# Patient Record
Sex: Male | Born: 1985
Health system: Southern US, Community
[De-identification: ages and names within clinical notes are randomized; demographics above are authoritative.]

---

## 2007-04-15 ENCOUNTER — Emergency Department (HOSPITAL_COMMUNITY): Admission: EM | Admit: 2007-04-15 | Discharge: 2007-04-15 | Payer: Self-pay | Admitting: Emergency Medicine

## 2008-10-14 ENCOUNTER — Emergency Department (HOSPITAL_COMMUNITY): Admission: EM | Admit: 2008-10-14 | Discharge: 2008-10-14 | Payer: Self-pay | Admitting: Emergency Medicine

## 2011-07-26 ENCOUNTER — Emergency Department (HOSPITAL_COMMUNITY)
Admission: EM | Admit: 2011-07-26 | Discharge: 2011-07-26 | Disposition: A | Discharge status: Home / Self Care | Payer: No Typology Code available for payment source | Attending: Emergency Medicine | Admitting: Emergency Medicine

## 2011-07-26 ENCOUNTER — Emergency Department (HOSPITAL_COMMUNITY): Payer: No Typology Code available for payment source

## 2011-07-26 ENCOUNTER — Encounter (HOSPITAL_COMMUNITY): Payer: Self-pay | Admitting: *Deleted

## 2011-07-26 DIAGNOSIS — R51 Headache: Secondary | ICD-10-CM | POA: Insufficient documentation

## 2011-07-26 DIAGNOSIS — S01409A Unspecified open wound of unspecified cheek and temporomandibular area, initial encounter: Secondary | ICD-10-CM | POA: Insufficient documentation

## 2011-07-26 DIAGNOSIS — S5012XA Contusion of left forearm, initial encounter: Secondary | ICD-10-CM

## 2011-07-26 DIAGNOSIS — S5010XA Contusion of unspecified forearm, initial encounter: Secondary | ICD-10-CM | POA: Insufficient documentation

## 2011-07-26 DIAGNOSIS — S51809A Unspecified open wound of unspecified forearm, initial encounter: Secondary | ICD-10-CM | POA: Insufficient documentation

## 2011-07-26 DIAGNOSIS — M79609 Pain in unspecified limb: Secondary | ICD-10-CM | POA: Insufficient documentation

## 2011-07-26 MED ORDER — OXYCODONE-ACETAMINOPHEN 5-325 MG PO TABS
1.0000 | ORAL_TABLET | Freq: Four times a day (QID) | ORAL | Status: AC | PRN
Start: 2011-07-26 — End: 2011-08-05

## 2011-07-26 MED ORDER — TETANUS-DIPHTH-ACELL PERTUSSIS 5-2.5-18.5 LF-MCG/0.5 IM SUSP
0.5000 mL | Freq: Once | INTRAMUSCULAR | Status: AC
  Administered 2011-07-26: 0.5 mL via INTRAMUSCULAR
  Filled 2011-07-26: qty 0.5

## 2011-07-26 MED ORDER — ACETAMINOPHEN 325 MG PO TABS
650.0000 mg | ORAL_TABLET | Freq: Once | ORAL | Status: AC
  Administered 2011-07-26: 650 mg via ORAL
  Filled 2011-07-26: qty 2

## 2011-07-26 MED ORDER — IBUPROFEN 800 MG PO TABS
800.0000 mg | ORAL_TABLET | Freq: Once | ORAL | Status: AC
  Administered 2011-07-26: 800 mg via ORAL
  Filled 2011-07-26: qty 1

## 2011-07-26 NOTE — Discharge Instructions (Signed)
X-ray was normal. Be sore for several days. Daily shower to forearm and simple dressing. Wound will scab over  the next couple weeks.  Pain meds

## 2011-07-26 NOTE — ED Notes (Signed)
MD at bedside. 
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
## 2011-07-26 NOTE — ED Notes (Signed)
Pt stable at discharge; pt states that he is going to walk home that he does not need a ride; Encouraged pt to obtain a ride; pt still refuses
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
## 2016-01-21 ENCOUNTER — Encounter: Payer: Self-pay | Admitting: Physician Assistant

## 2016-01-21 ENCOUNTER — Ambulatory Visit (INDEPENDENT_AMBULATORY_CARE_PROVIDER_SITE_OTHER): Payer: BLUE CROSS/BLUE SHIELD | Admitting: Physician Assistant

## 2016-01-21 VITALS — BP 130/80 | HR 72 | Temp 98.4°F | Resp 16 | Ht 63.5 in | Wt 169.0 lb

## 2016-01-21 DIAGNOSIS — Z0000 Encounter for general adult medical examination without abnormal findings: Secondary | ICD-10-CM

## 2016-01-21 DIAGNOSIS — Z7689 Persons encountering health services in other specified circumstances: Secondary | ICD-10-CM

## 2016-01-21 NOTE — Progress Notes (Signed)
Patient ID: Ronald Combs MRN: [id number], DOB: 1985/11/21 30 y.o. Date of Encounter: 01/21/2016, 10:49 AM    Chief Complaint: Physical (CPE)  HPI: 30 y.o. y/o AA male here for CPE.   Patient presents as a new patient to establish care. He states that he needs this form completed which states that he has had annual physical and screening including blood pressure cholesterol and blood sugar. He states that he has no other complaints or concerns that he needs to address today.  He just ate a sandwich about 20 minutes ago. He is not fasting. He states that he works 7 AM to 5 PM Monday through Thursday but is off on Fridays. Can return this Friday fasting for labs. He says that he did have labs drawn at his job several months ago and will see if he get a copy of his lab results to me. If not he is going to return Friday morning for fasting labs.   Review of Systems: Consitutional: No fever, chills, fatigue, night sweats, lymphadenopathy, or weight changes. Eyes: No visual changes, eye redness, or discharge. ENT/Mouth: Ears: No otalgia, tinnitus, hearing loss, discharge. Nose: No congestion, rhinorrhea, sinus pain, or epistaxis. Throat: No sore throat, post nasal drip, or teeth pain. Cardiovascular: No CP, palpitations, diaphoresis, DOE, edema, orthopnea, PND. Respiratory: No cough, hemoptysis, SOB, or wheezing. Gastrointestinal: No anorexia, dysphagia, reflux, pain, nausea, vomiting, hematemesis, diarrhea, constipation, BRBPR, or melena. Genitourinary: No dysuria, frequency, urgency, hematuria, incontinence, nocturia, decreased urinary stream, discharge, impotence, or testicular pain/masses. Musculoskeletal: No decreased ROM, myalgias, stiffness, joint swelling, or weakness. Skin: No rash, erythema, lesion changes, pain, warmth, jaundice, or pruritis. Neurological: No headache, dizziness, syncope, seizures, tremors, memory loss, coordination problems, or paresthesias. Psychological: No  anxiety, depression, hallucinations, SI/HI. Endocrine: No fatigue, polydipsia, polyphagia, polyuria, or known diabetes. All other systems were reviewed and are otherwise negative.  History reviewed. No pertinent past medical history.   History reviewed. No pertinent surgical history.  Home Meds:  Outpatient Medications Prior to Visit  Medication Sig Dispense Refill  . ibuprofen (ADVIL,MOTRIN) 200 MG tablet Take 400 mg by mouth every 6 (six) hours as needed. For pain     No facility-administered medications prior to visit.     Allergies: No Known Allergies  Social History   Social History  . Marital status: Single    Spouse name: N/A  . Number of children: N/A  . Years of education: N/A   Occupational History  . Not on file.   Social History Main Topics  . Smoking status: Current Some Day Smoker  . Smokeless tobacco: Former NeurosurgeonUser  . Alcohol use Yes  . Drug use: No  . Sexual activity: No   Other Topics Concern  . Not on file   Social History Narrative  . No narrative on file  He states that he very rarely smokes.  History reviewed. No pertinent family history.  Both of his parents are living and have no known medical problems. He has one sister and 2 brothers and they are all living and no known medical problems.  Physical Exam: Blood pressure 130/80, pulse 72, temperature 98.4 F (36.9 C), temperature source Oral, resp. rate 16, height 5' 3.5" (1.613 m), weight 169 lb (76.7 kg), SpO2 99 %.  General: Well developed, well nourished AAM. Appears in no acute distress. HEENT: Normocephalic, atraumatic. Conjunctiva pink, sclera non-icteric. Pupils 2 mm constricting to 1 mm, round, regular, and equally reactive to light and accomodation. EOMI. Internal auditory  canal clear. TMs with good cone of light and without pathology. Nasal mucosa pink. Nares are without discharge. No sinus tenderness. Oral mucosa pink. Pharynx without exudate.   Neck: Supple. Trachea midline. No  thyromegaly. Full ROM. No lymphadenopathy. Lungs: Clear to auscultation bilaterally without wheezes, rales, or rhonchi. Breathing is of normal effort and unlabored. Cardiovascular: RRR with S1 S2. No murmurs, rubs, or gallops. Distal pulses 2+ symmetrically. No carotid or abdominal bruits. Abdomen: Soft, non-tender, non-distended with normoactive bowel sounds. No hepatosplenomegaly or masses. No rebound/guarding. No CVA tenderness. No hernias. Musculoskeletal: Full range of motion and 5/5 strength throughout. Skin: Warm and moist without erythema, ecchymosis, wounds, or rash. Neuro: A+Ox3. CN II-XII grossly intact. Moves all extremities spontaneously. Full sensation throughout. Normal gait. DTR 2+ throughout upper and lower extremities.  Psych:  Responds to questions appropriately with a normal affect.   Assessment/Plan:  30 y.o. y/o AA male here for CPE  -1. Encounter to establish care  2. Encounter for preventive health examination  A. Screening Labs: - CBC with Differential/Platelet; Future - COMPLETE METABOLIC PANEL WITH GFR; Future - Lipid panel; Future - TSH; Future  B. Immunizations: Flu----------------- recommended flu vaccine but he defers. Tetanus------- he reports that he had a tetanus vaccine 2-4 years ago. Says that this is up to date. Pneumococcal--- he very rarely smokes. Do not think this warrants a pneumonia vaccine.  He will return for labs on Friday morning. His form for his wellness program will be given back to him when he comes Friday for labs. His form does not require the actual lab results but rather just my signature stating that he did have age appropriate exam and that we did check blood pressure cholesterol and blood sugar.  Signed:   [address]Valoria Tamburri Beth Dale[place], [place]  01/21/2016 10:49 AM

## 2016-01-23 ENCOUNTER — Other Ambulatory Visit: Payer: BLUE CROSS/BLUE SHIELD

## 2016-01-23 DIAGNOSIS — Z0000 Encounter for general adult medical examination without abnormal findings: Secondary | ICD-10-CM | POA: Diagnosis not present

## 2016-01-23 LAB — CBC WITH DIFFERENTIAL/PLATELET
Basophils Absolute: 47 cells/uL (ref 0–200)
Basophils Relative: 1 %
Eosinophils Absolute: 141 cells/uL (ref 15–500)
Eosinophils Relative: 3 %
HEMATOCRIT: 46 % (ref 38.5–50.0)
Hemoglobin: 15.7 g/dL (ref 13.0–17.0)
LYMPHS PCT: 45 %
Lymphs Abs: 2115 cells/uL (ref 850–3900)
MCH: 31.5 pg (ref 27.0–33.0)
MCHC: 34.1 g/dL (ref 32.0–36.0)
MCV: 92.2 fL (ref 80.0–100.0)
MONO ABS: 376 {cells}/uL (ref 200–950)
MPV: 10.7 fL (ref 7.5–12.5)
Monocytes Relative: 8 %
NEUTROS PCT: 43 %
Neutro Abs: 2021 cells/uL (ref 1500–7800)
Platelets: 231 10*3/uL (ref 140–400)
RBC: 4.99 MIL/uL (ref 4.20–5.80)
RDW: 13.9 % (ref 11.0–15.0)
WBC: 4.7 10*3/uL (ref 3.8–10.8)

## 2016-01-23 LAB — COMPLETE METABOLIC PANEL WITH GFR
ALT: 68 U/L — ABNORMAL HIGH (ref 9–46)
AST: 35 U/L (ref 10–40)
Albumin: 4.8 g/dL (ref 3.6–5.1)
Alkaline Phosphatase: 69 U/L (ref 40–115)
BUN: 15 mg/dL (ref 7–25)
CALCIUM: 10.6 mg/dL — AB (ref 8.6–10.3)
CHLORIDE: 102 mmol/L (ref 98–110)
CO2: 28 mmol/L (ref 20–31)
Creat: 0.99 mg/dL (ref 0.60–1.35)
GFR, Est Non African American: >89 mL/min (ref >=60)
Glucose, Bld: 95 mg/dL (ref 70–99)
POTASSIUM: 4.6 mmol/L (ref 3.5–5.3)
Sodium: 138 mmol/L (ref 135–146)
Total Bilirubin: 1.1 mg/dL (ref 0.2–1.2)
Total Protein: 7.6 g/dL (ref 6.1–8.1)

## 2016-01-23 LAB — TSH: TSH: 1.24 mIU/L (ref 0.40–4.50)

## 2016-01-23 LAB — LIPID PANEL
CHOL/HDL RATIO: 2.9 ratio (ref <5.0)
CHOLESTEROL: 151 mg/dL (ref <200)
HDL: 52 mg/dL (ref >40)
LDL Cholesterol: 78 mg/dL (ref <100)
TRIGLYCERIDES: 105 mg/dL (ref <150)
VLDL: 21 mg/dL (ref <30)

## 2017-01-24 ENCOUNTER — Ambulatory Visit (INDEPENDENT_AMBULATORY_CARE_PROVIDER_SITE_OTHER): Payer: BLUE CROSS/BLUE SHIELD | Admitting: Physician Assistant

## 2017-01-24 ENCOUNTER — Other Ambulatory Visit: Payer: Self-pay

## 2017-01-24 ENCOUNTER — Encounter: Payer: Self-pay | Admitting: Physician Assistant

## 2017-01-24 VITALS — BP 118/82 | HR 64 | Temp 98.4°F | Resp 16 | Ht 64.0 in | Wt 162.0 lb

## 2017-01-24 DIAGNOSIS — Z114 Encounter for screening for human immunodeficiency virus [HIV]: Secondary | ICD-10-CM | POA: Diagnosis not present

## 2017-01-24 DIAGNOSIS — Z0000 Encounter for general adult medical examination without abnormal findings: Secondary | ICD-10-CM | POA: Diagnosis not present

## 2017-01-24 DIAGNOSIS — R739 Hyperglycemia, unspecified: Secondary | ICD-10-CM | POA: Diagnosis not present

## 2017-01-24 NOTE — Progress Notes (Signed)
Patient ID: Ronald Combs MRN: [id number], DOB: 03-Feb-1986 31 y.o. Date of Encounter: 01/24/2017, 10:32 AM    Chief Complaint: Physical (CPE)  HPI: 31 y.o. y/o AA male here for CPE.   01/2016: Patient presents as a new patient to establish care. He states that he needs this form completed which states that he has had annual physical and screening including blood pressure cholesterol and blood sugar. He states that he has no other complaints or concerns that he needs to address today.  He just ate a sandwich about 20 minutes ago. He is not fasting. He states that he works 7 AM to 5 PM Monday through Thursday but is off on Fridays. Can return this Friday fasting for labs. He says that he did have labs drawn at his job several months ago and will see if he get a copy of his lab results to me. If not he is going to return Friday morning for fasting labs.   01/24/2017: Reports that he has had no medical issues this past year.  Says that he really has not even been sick at all. Also reports that he has no updates regarding family medical history. He has no specific concerns to address. Has form to document that he is come in for age-appropriate wellness exam. Is fasting and agreeable to do labs.  Does not want flu vaccine.    Review of Systems: Consitutional: No fever, chills, fatigue, night sweats, lymphadenopathy, or weight changes. Eyes: No visual changes, eye redness, or discharge. ENT/Mouth: Ears: No otalgia, tinnitus, hearing loss, discharge. Nose: No congestion, rhinorrhea, sinus pain, or epistaxis. Throat: No sore throat, post nasal drip, or teeth pain. Cardiovascular: No CP, palpitations, diaphoresis, DOE, edema, orthopnea, PND. Respiratory: No cough, hemoptysis, SOB, or wheezing. Gastrointestinal: No anorexia, dysphagia, reflux, pain, nausea, vomiting, hematemesis, diarrhea, constipation, BRBPR, or melena. Genitourinary: No dysuria, frequency, urgency, hematuria,  incontinence, nocturia, decreased urinary stream, discharge, impotence, or testicular pain/masses. Musculoskeletal: No decreased ROM, myalgias, stiffness, joint swelling, or weakness. Skin: No rash, erythema, lesion changes, pain, warmth, jaundice, or pruritis. Neurological: No headache, dizziness, syncope, seizures, tremors, memory loss, coordination problems, or paresthesias. Psychological: No anxiety, depression, hallucinations, SI/HI. Endocrine: No fatigue, polydipsia, polyphagia, polyuria, or known diabetes. All other systems were reviewed and are otherwise negative.  History reviewed. No pertinent past medical history.   History reviewed. No pertinent surgical history.  Home Meds:  Outpatient Medications Prior to Visit  Medication Sig Dispense Refill  . ibuprofen (ADVIL,MOTRIN) 200 MG tablet Take 400 mg by mouth every 6 (six) hours as needed. For pain     No facility-administered medications prior to visit.     Allergies: No Known Allergies  Social History   Socioeconomic History  . Marital status: Single    Spouse name: Not on file  . Number of children: Not on file  . Years of education: Not on file  . Highest education level: Not on file  Social Needs  . Financial resource strain: Not on file  . Food insecurity - worry: Not on file  . Food insecurity - inability: Not on file  . Transportation needs - medical: Not on file  . Transportation needs - non-medical: Not on file  Occupational History  . Not on file  Tobacco Use  . Smoking status: Former Smoker    Last attempt to quit: 03/26/2016    Years since quitting: 0.8  . Smokeless tobacco: Former Engineer, waterUser  Substance and Sexual Activity  . Alcohol use:  Yes  . Drug use: No  . Sexual activity: No  Other Topics Concern  . Not on file  Social History Narrative  . Not on file  He states that he very rarely smokes.  History reviewed. No pertinent family history.  Both of his parents are living and have no known  medical problems. He has one sister and 2 brothers and they are all living and no known medical problems.  Physical Exam: Blood pressure 118/82, pulse 64, temperature 98.4 F (36.9 C), temperature source Oral, resp. rate 16, height 5\' 4"  (1.626 m), weight 73.5 kg (162 lb), SpO2 98 %.  General: Well developed, well nourished AAM. Appears in no acute distress. HEENT: Normocephalic, atraumatic. Conjunctiva pink, sclera non-icteric. Pupils 2 mm constricting to 1 mm, round, regular, and equally reactive to light and accomodation. EOMI. Internal auditory canal clear. TMs with good cone of light and without pathology. Nasal mucosa pink. Nares are without discharge. No sinus tenderness. Oral mucosa pink. Pharynx without exudate.   Neck: Supple. Trachea midline. No thyromegaly. Full ROM. No lymphadenopathy. Lungs: Clear to auscultation bilaterally without wheezes, rales, or rhonchi. Breathing is of normal effort and unlabored. Cardiovascular: RRR with S1 S2. No murmurs, rubs, or gallops. Distal pulses 2+ symmetrically. No carotid or abdominal bruits. Abdomen: Soft, non-tender, non-distended with normoactive bowel sounds. No hepatosplenomegaly or masses. No rebound/guarding. No CVA tenderness. No hernias. Musculoskeletal: Full range of motion and 5/5 strength throughout. Skin: Warm and moist without erythema, ecchymosis, wounds, or rash. Neuro: A+Ox3. CN II-XII grossly intact. Moves all extremities spontaneously. Full sensation throughout. Normal gait.  Psych:  Responds to questions appropriately with a normal affect.   Assessment/Plan:  31 y.o. y/o AA male here for CPE   Encounter for preventive health examination A. Screening Labs: - HIV antibody - CBC with Differential/Platelet - COMPLETE METABOLIC PANEL WITH GFR - Lipid panel B. Immunizations: Flu----------------- recommended flu vaccine but he defers. Tetanus------- he reports that he had a tetanus vaccine 2-4 years ago. Says that this is up  to date. Pneumococcal--- he very rarely smokes. Do not think this warrants a pneumonia vaccine.   Screening for HIV (human immunodeficiency virus) - HIV antibody  Follow-up in 1 year or sooner if needed.   Signed:   [address]Mary Beth Union[place], [place]  01/24/2017 10:32 AM

## 2017-01-29 LAB — COMPLETE METABOLIC PANEL WITH GFR
AG Ratio: 1.6 (calc) (ref 1.0–2.5)
ALKALINE PHOSPHATASE (APISO): 70 U/L (ref 40–115)
ALT: 43 U/L (ref 9–46)
AST: 38 U/L (ref 10–40)
Albumin: 4.7 g/dL (ref 3.6–5.1)
BILIRUBIN TOTAL: 1 mg/dL (ref 0.2–1.2)
BUN: 12 mg/dL (ref 7–25)
CHLORIDE: 101 mmol/L (ref 98–110)
CO2: 27 mmol/L (ref 20–32)
CREATININE: 1.01 mg/dL (ref 0.60–1.35)
Calcium: 10.2 mg/dL (ref 8.6–10.3)
GFR, Est African American: 113 mL/min/{1.73_m2} (ref >=60)
GFR, Est Non African American: 97 mL/min/{1.73_m2} (ref >=60)
GLOBULIN: 2.9 g/dL (ref 1.9–3.7)
Glucose, Bld: 109 mg/dL — ABNORMAL HIGH (ref 65–99)
POTASSIUM: 4.8 mmol/L (ref 3.5–5.3)
SODIUM: 140 mmol/L (ref 135–146)
Total Protein: 7.6 g/dL (ref 6.1–8.1)

## 2017-01-29 LAB — CBC WITH DIFFERENTIAL/PLATELET
BASOS ABS: 41 {cells}/uL (ref 0–200)
Basophils Relative: 0.8 %
EOS PCT: 2.4 %
Eosinophils Absolute: 122 cells/uL (ref 15–500)
HEMATOCRIT: 47.6 % (ref 38.5–50.0)
Hemoglobin: 16.8 g/dL (ref 13.2–17.1)
LYMPHS ABS: 2066 {cells}/uL (ref 850–3900)
MCH: 31.8 pg (ref 27.0–33.0)
MCHC: 35.3 g/dL (ref 32.0–36.0)
MCV: 90 fL (ref 80.0–100.0)
MONOS PCT: 9.8 %
MPV: 11.6 fL (ref 7.5–12.5)
NEUTROS PCT: 46.5 %
Neutro Abs: 2372 cells/uL (ref 1500–7800)
Platelets: 247 10*3/uL (ref 140–400)
RBC: 5.29 10*6/uL (ref 4.20–5.80)
RDW: 12.9 % (ref 11.0–15.0)
Total Lymphocyte: 40.5 %
WBC mixed population: 500 cells/uL (ref 200–950)
WBC: 5.1 10*3/uL (ref 3.8–10.8)

## 2017-01-29 LAB — LIPID PANEL
Cholesterol: 145 mg/dL (ref <200)
HDL: 56 mg/dL (ref >40)
LDL Cholesterol (Calc): 71 mg/dL (calc)
NON-HDL CHOLESTEROL (CALC): 89 mg/dL (ref <130)
Total CHOL/HDL Ratio: 2.6 (calc) (ref <5.0)
Triglycerides: 94 mg/dL (ref <150)

## 2017-01-29 LAB — TEST AUTHORIZATION 2

## 2017-01-29 LAB — HEMOGLOBIN A1C W/OUT EAG: HEMOGLOBIN A1C: 5.7 %{Hb} — AB (ref <5.7)

## 2017-01-29 LAB — HIV ANTIBODY (ROUTINE TESTING W REFLEX): HIV 1&2 Ab, 4th Generation: NONREACTIVE

## 2017-08-28 ENCOUNTER — Encounter (HOSPITAL_COMMUNITY): Payer: Self-pay | Admitting: Emergency Medicine

## 2017-08-28 ENCOUNTER — Other Ambulatory Visit: Payer: Self-pay

## 2017-08-28 ENCOUNTER — Emergency Department (HOSPITAL_COMMUNITY)
Admission: EM | Admit: 2017-08-28 | Discharge: 2017-08-29 | Disposition: A | Discharge status: Home / Self Care | Payer: BLUE CROSS/BLUE SHIELD | Attending: Emergency Medicine | Admitting: Emergency Medicine

## 2017-08-28 ENCOUNTER — Emergency Department (HOSPITAL_COMMUNITY): Payer: BLUE CROSS/BLUE SHIELD

## 2017-08-28 DIAGNOSIS — F17210 Nicotine dependence, cigarettes, uncomplicated: Secondary | ICD-10-CM | POA: Diagnosis not present

## 2017-08-28 DIAGNOSIS — M79674 Pain in right toe(s): Secondary | ICD-10-CM

## 2017-08-28 MED ORDER — ONDANSETRON HCL 4 MG PO TABS
4.0000 mg | ORAL_TABLET | Freq: Once | ORAL | Status: AC
  Administered 2017-08-28: 4 mg via ORAL
  Filled 2017-08-28: qty 1

## 2017-08-28 MED ORDER — KETOROLAC TROMETHAMINE 10 MG PO TABS
10.0000 mg | ORAL_TABLET | Freq: Once | ORAL | Status: AC
  Administered 2017-08-28: 10 mg via ORAL
  Filled 2017-08-28: qty 1

## 2017-08-28 MED ORDER — COLCHICINE 0.6 MG PO TABS
0.6000 mg | ORAL_TABLET | Freq: Once | ORAL | Status: AC
  Administered 2017-08-28: 0.6 mg via ORAL
  Filled 2017-08-28: qty 1

## 2017-08-28 MED ORDER — PREDNISONE 20 MG PO TABS
40.0000 mg | ORAL_TABLET | Freq: Once | ORAL | Status: AC
  Administered 2017-08-28: 40 mg via ORAL
  Filled 2017-08-28: qty 2

## 2017-08-28 NOTE — ED Triage Notes (Signed)
Pt c/o great toe pain without injury that started today.
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
## 2017-08-29 MED ORDER — DEXAMETHASONE 4 MG PO TABS: 4.0000 mg | ORAL_TABLET | Freq: Two times a day (BID) | ORAL | 0 refills | Status: DC

## 2017-08-29 MED ORDER — IBUPROFEN 600 MG PO TABS: 600.0000 mg | ORAL_TABLET | Freq: Four times a day (QID) | ORAL | 0 refills | Status: DC

## 2017-08-29 MED ORDER — COLCHICINE 0.6 MG PO CAPS: ORAL_CAPSULE | ORAL | 0 refills | Status: DC

## 2017-08-29 NOTE — Discharge Instructions (Addendum)
Your vital signs have been reviewed.  Your oxygen level is 96% on room air.  The x-ray of your toe is negative for fracture, dislocation, or foreign body.  Question of your pain is related to a tendinitis, or a gout, or a form of an arthritis.  Please use colchicine daily with food.  Use Decadron 2 times daily with food.  Use ibuprofen with breakfast, lunch, dinner, and at bedtime.  Please see Dr. Durwin Noraixon for additional evaluation and management if not improving.

## 2017-08-29 NOTE — ED Notes (Signed)
Pt ambulatory to waiting room. Pt verbalized understanding of discharge instructions.   

## 2018-03-09 ENCOUNTER — Encounter: Payer: BLUE CROSS/BLUE SHIELD | Admitting: Family Medicine

## 2018-03-21 ENCOUNTER — Encounter: Payer: Self-pay | Admitting: Family Medicine

## 2018-08-15 ENCOUNTER — Encounter: Payer: BLUE CROSS/BLUE SHIELD | Admitting: Family Medicine

## 2018-08-25 ENCOUNTER — Other Ambulatory Visit: Payer: Self-pay

## 2018-08-25 ENCOUNTER — Ambulatory Visit (INDEPENDENT_AMBULATORY_CARE_PROVIDER_SITE_OTHER): Payer: BC Managed Care – PPO | Admitting: Family Medicine

## 2018-08-25 ENCOUNTER — Encounter: Payer: Self-pay | Admitting: Family Medicine

## 2018-08-25 VITALS — BP 128/64 | HR 82 | Temp 98.7°F | Resp 14 | Ht 65.0 in | Wt 172.0 lb

## 2018-08-25 DIAGNOSIS — Z13220 Encounter for screening for lipoid disorders: Secondary | ICD-10-CM | POA: Diagnosis not present

## 2018-08-25 DIAGNOSIS — Z0000 Encounter for general adult medical examination without abnormal findings: Secondary | ICD-10-CM | POA: Diagnosis not present

## 2018-08-25 DIAGNOSIS — Z87898 Personal history of other specified conditions: Secondary | ICD-10-CM

## 2018-08-25 DIAGNOSIS — Z1329 Encounter for screening for other suspected endocrine disorder: Secondary | ICD-10-CM

## 2018-08-25 DIAGNOSIS — Z130 Encounter for screening for diseases of the blood and blood-forming organs and certain disorders involving the immune mechanism: Secondary | ICD-10-CM | POA: Diagnosis not present

## 2018-08-25 DIAGNOSIS — Z13228 Encounter for screening for other metabolic disorders: Secondary | ICD-10-CM

## 2018-08-25 NOTE — Patient Instructions (Addendum)
When you labs are complete the [place] form will be completed and available to you.  Follow up with your PCP in 1 year for next annual wellness exam  Health Maintenance, Male A healthy lifestyle and preventive care is important for your health and wellness. Ask your health care provider about what schedule of regular examinations is right for you. What should I know about weight and diet? Eat a Healthy Diet  Eat plenty of vegetables, fruits, whole grains, low-fat dairy products, and lean protein.  Do not eat a lot of foods high in solid fats, added sugars, or salt.  Maintain a Healthy Weight Regular exercise can help you achieve or maintain a healthy weight. You should:  Do at least 150 minutes of exercise each week. The exercise should increase your heart rate and make you sweat (moderate-intensity exercise).  Do strength-training exercises at least twice a week. Watch Your Levels of Cholesterol and Blood Lipids  Have your blood tested for lipids and cholesterol every 5 years starting at 35 years of age. If you are at high risk for heart disease, you should start having your blood tested when you are 35 years old. You may need to have your cholesterol levels checked more often if: ? Your lipid or cholesterol levels are high. ? You are older than 35 years of age. ? You are at high risk for heart disease. What should I know about cancer screening? Many types of cancers can be detected early and may often be prevented. Lung Cancer  You should be screened every year for lung cancer if: ? You are a current smoker who has smoked for at least 30 years. ? You are a former smoker who has quit within the past 15 years.  Talk to your health care provider about your screening options, when you should start screening, and how often you should be screened. Colorectal Cancer  Routine colorectal cancer screening usually begins at 35 years of age and should be repeated every 5-10  years until you are 35 years old. You may need to be screened more often if early forms of precancerous polyps or small growths are found. Your health care provider may recommend screening at an earlier age if you have risk factors for colon cancer.  Your health care provider may recommend using home test kits to check for hidden blood in the stool.  A small camera at the end of a tube can be used to examine your colon (sigmoidoscopy or colonoscopy). This checks for the earliest forms of colorectal cancer. Prostate and Testicular Cancer  Depending on your age and overall health, your health care provider may do certain tests to screen for prostate and testicular cancer.  Talk to your health care provider about any symptoms or concerns you have about testicular or prostate cancer. Skin Cancer  Check your skin from head to toe regularly.  Tell your health care provider about any new moles or changes in moles, especially if: ? There is a change in a mole's size, shape, or color. ? You have a mole that is larger than a pencil eraser.  Always use sunscreen. Apply sunscreen liberally and repeat throughout the day.  Protect yourself by wearing long sleeves, pants, a wide-brimmed hat, and sunglasses when outside. What should I know about heart disease, diabetes, and high blood pressure?  If you are 37-32 years of age, have your blood pressure checked every 3-5 years. If you are 49 years of age or older,  have your blood pressure checked every year. You should have your blood pressure measured twice-once when you are at a hospital or clinic, and once when you are not at a hospital or clinic. Record the average of the two measurements. To check your blood pressure when you are not at a hospital or clinic, you can use: ? An automated blood pressure machine at a pharmacy. ? A home blood pressure monitor.  Talk to your health care provider about your target blood pressure.  If you are between 2045-35  years old, ask your health care provider if you should take aspirin to prevent heart disease.  Have regular diabetes screenings by checking your fasting blood sugar level. ? If you are at a normal weight and have a low risk for diabetes, have this test once every three years after the age of 35. ? If you are overweight and have a high risk for diabetes, consider being tested at a younger age or more often.  A one-time screening for abdominal aortic aneurysm (AAA) by ultrasound is recommended for men aged 65-75 years who are current or former smokers. What should I know about preventing infection? Hepatitis B If you have a higher risk for hepatitis B, you should be screened for this virus. Talk with your health care provider to find out if you are at risk for hepatitis B infection. Hepatitis C Blood testing is recommended for:  Everyone born from [id number] through 1967.  Anyone with known risk factors for hepatitis C. Sexually Transmitted Diseases (STDs)  You should be screened each year for STDs including gonorrhea and chlamydia if: ? You are sexually active and are younger than 35 years of age. ? You are older than 35 years of age and your health care provider tells you that you are at risk for this type of infection. ? Your sexual activity has changed since you were last screened and you are at an increased risk for chlamydia or gonorrhea. Ask your health care provider if you are at risk.  Talk with your health care provider about whether you are at high risk of being infected with HIV. Your health care provider may recommend a prescription medicine to help prevent HIV infection. What else can I do?  Schedule regular health, dental, and eye exams.  Stay current with your vaccines (immunizations).  Do not use any tobacco products, such as cigarettes, chewing tobacco, and e-cigarettes. If you need help quitting, ask your health care provider.  Limit alcohol intake to no more than 2 drinks  per day. One drink equals 12 ounces of beer, 5 ounces of wine, or 1 ounces of hard liquor.  Do not use street drugs.  Do not share needles.  Ask your health care provider for help if you need support or information about quitting drugs.  Tell your health care provider if you often feel depressed.  Tell your health care provider if you have ever been abused or do not feel safe at home. This information is not intended to replace advice given to you by your health care provider. Make sure you discuss any questions you have with your health care provider. Document Released: 08/21/2007 Document Revised: 10/22/2015 Document Reviewed: 11/26/2014 Elsevier Interactive Patient Education  2019 [employer]

## 2018-08-25 NOTE — Progress Notes (Signed)
Patient: Ronald Combs, Male    DOB: 13-Nov-1985, 33 y.o.   MRN: [id number] Visit Date: 08/25/2018  Today's Provider: Danelle BerryLeisa Tyjae Shvartsman, PA-C   Chief Complaint  Patient presents with  . Annual Exam    is not fasting   Subjective:    Annual physical exam Ronald GilfordShawn B Pinkham is a 33 y.o. male who presents today for health maintenance and complete physical. He feels well. He reports exercising a few days a week. He reports he is sleeping poorly.  -----------------------------------------------------------------  He has no past med hx Previously did HIV screen, currently one male partner, only one sexual partner in the past year No known family med hx Has no concerns - needs for done for work.  Reviewed last labs A1C = prediabetes.  He eats whatever he wants and his weight is stable.    Review of Systems  Constitutional: Negative.  Negative for activity change, appetite change, fatigue and unexpected weight change.  HENT: Negative.   Eyes: Negative.   Respiratory: Negative.  Negative for shortness of breath.   Cardiovascular: Negative.  Negative for chest pain, palpitations and leg swelling.  Gastrointestinal: Negative.  Negative for abdominal pain and blood in stool.  Endocrine: Negative.   Genitourinary: Negative.  Negative for decreased urine volume, difficulty urinating, testicular pain and urgency.  Skin: Negative.  Negative for color change and pallor.  Allergic/Immunologic: Negative.   Neurological: Negative.  Negative for syncope, weakness, light-headedness and numbness.  Psychiatric/Behavioral: Negative.  Negative for confusion, dysphoric mood, self-injury and suicidal ideas. The patient is not nervous/anxious.   All other systems reviewed and are negative.   Social History      He  reports that he quit smoking about 2 years ago. He has quit using smokeless tobacco. He reports current alcohol use of about 1.0 standard drinks of alcohol per week. He reports that he does not  use drugs.       Social History   Socioeconomic History  . Marital status: Significant Other    Spouse name: Not on file  . Number of children: Not on file  . Years of education: Not on file  . Highest education level: Not on file  Occupational History  . Not on file  Social Needs  . Financial resource strain: Not on file  . Food insecurity    Worry: Not on file    Inability: Not on file  . Transportation needs    Medical: Not on file    Non-medical: Not on file  Tobacco Use  . Smoking status: Former Smoker    Quit date: 03/26/2016    Years since quitting: 2.4  . Smokeless tobacco: Former Engineer, waterUser  Substance and Sexual Activity  . Alcohol use: Yes    Alcohol/week: 1.0 standard drinks    Types: 1 Cans of beer per week    Comment: occassional  . Drug use: No  . Sexual activity: Yes    Comment: one partner  Lifestyle  . Physical activity    Days per week: Not on file    Minutes per session: Not on file  . Stress: Not on file  Relationships  . Social Musicianconnections    Talks on phone: Not on file    Gets together: Not on file    Attends religious service: Not on file    Active member of club or organization: Not on file    Attends meetings of clubs or organizations: Not on file    Relationship status:  Not on file  Other Topics Concern  . Not on file  Social History Narrative  . Not on file    History reviewed. No pertinent past medical history.   There are no active problems to display for this patient.   History reviewed. No pertinent surgical history.  Family History        Family Status  Relation Name Status  . Mother  Alive  . Father  Alive  . Sister  Alive  . Brother  Alive  . Brother  Alive        His family history is not on file.      No Known Allergies  No current outpatient medications on file.   Patient Care Team: Donita BrooksPickard, Warren T, MD as PCP - General (Family Medicine)      Objective:   Vitals: BP 128/64   Pulse 82   Temp 98.7 F  (37.1 C) (Oral)   Resp 14   Ht 5\' 5"  (1.651 m)   Wt 172 lb (78 kg)   SpO2 97%   BMI 28.62 kg/m    Vitals:   08/25/18 1509  BP: 128/64  Pulse: 82  Resp: 14  Temp: 98.7 F (37.1 C)  TempSrc: Oral  SpO2: 97%  Weight: 172 lb (78 kg)  Height: 5\' 5"  (1.651 m)     Physical Exam Vitals signs and nursing note reviewed.  Constitutional:      General: He is not in acute distress.    Appearance: Normal appearance. He is well-developed. He is not ill-appearing, toxic-appearing or diaphoretic.  HENT:     Head: Normocephalic and atraumatic.     Jaw: No trismus.     Right Ear: Tympanic membrane, ear canal and external ear normal.     Left Ear: Tympanic membrane, ear canal and external ear normal.     Nose: Nose normal. No mucosal edema or rhinorrhea.     Right Sinus: No maxillary sinus tenderness or frontal sinus tenderness.     Left Sinus: No maxillary sinus tenderness or frontal sinus tenderness.     Mouth/Throat:     Mouth: Mucous membranes are moist.     Pharynx: Uvula midline. No oropharyngeal exudate, posterior oropharyngeal erythema or uvula swelling.  Eyes:     General: Lids are normal. No scleral icterus.    Conjunctiva/sclera: Conjunctivae normal.     Pupils: Pupils are equal, round, and reactive to light.  Neck:     Musculoskeletal: Normal range of motion and neck supple.     Trachea: Trachea and phonation normal. No tracheal deviation.  Cardiovascular:     Rate and Rhythm: Normal rate and regular rhythm.     Pulses: Normal pulses.          Radial pulses are 2+ on the right side and 2+ on the left side.       Posterior tibial pulses are 2+ on the right side and 2+ on the left side.     Heart sounds: Normal heart sounds. No murmur. No friction rub. No gallop.   Pulmonary:     Effort: Pulmonary effort is normal. No respiratory distress.     Breath sounds: Normal breath sounds. No stridor. No wheezing, rhonchi or rales.  Abdominal:     General: Bowel sounds are normal.  There is no distension.     Palpations: Abdomen is soft.     Tenderness: There is no abdominal tenderness. There is no guarding or rebound.  Musculoskeletal: Normal range of  motion.     Right lower leg: No edema.     Left lower leg: No edema.  Skin:    General: Skin is warm and dry.     Capillary Refill: Capillary refill takes less than 2 seconds.     Findings: No rash.  Neurological:     Mental Status: He is alert and oriented to person, place, and time.     Gait: Gait normal.  Psychiatric:        Mood and Affect: Mood normal.        Speech: Speech normal.        Behavior: Behavior normal.        Thought Content: Thought content normal.        Judgment: Judgment normal.      Depression Screen PHQ 2/9 Scores 08/25/2018 01/24/2017 01/21/2016  PHQ - 2 Score 0 0 0  PHQ- 9 Score - - 0     Office Visit from 08/25/2018 in [facility]  AUDIT-C Score  2         Assessment & Plan:     Routine Health Maintenance and Physical Exam  Exercise Activities and Dietary recommendations Goals - work on healthy diet and exercise to avoid pills ("no pills" per the pt)    Discussed health benefits of physical activity, and encouraged him to engage in regular exercise appropriate for his age and condition.   Immunization History  Administered Date(s) Administered  . Tdap 07/26/2011    Health Maintenance  Topic Date Due  . INFLUENZA VACCINE  10/07/2018  . TETANUS/TDAP  07/25/2021  . HIV Screening  Completed      Pt well appearing, VSS and at goal, family hx unknown, he will ask and update [place] if there is any hx. He will return for fasting labs.    Problem List Items Addressed This Visit    None    Visit Diagnoses    History of prediabetes    -  Primary   Relevant Orders   Hemoglobin A1c   Routine general medical examination at a health care facility       Relevant Orders   CBC with Differential/Platelet   COMPLETE METABOLIC PANEL WITH GFR   Hemoglobin  A1c   Lipid panel   Screening for deficiency anemia       Relevant Orders   CBC with Differential/Platelet   Screening for lipoid disorders       Relevant Orders   Lipid panel   Screening for endocrine, metabolic and immunity disorder       Relevant Orders   COMPLETE METABOLIC PANEL WITH GFR   Hemoglobin A1c   Lipid panel        Delsa Grana, PA-C 08/25/18 4:39 PM  [facility]

## 2019-02-16 ENCOUNTER — Other Ambulatory Visit: Payer: BC Managed Care – PPO

## 2019-02-16 ENCOUNTER — Other Ambulatory Visit: Payer: Self-pay

## 2019-02-16 DIAGNOSIS — Z1329 Encounter for screening for other suspected endocrine disorder: Secondary | ICD-10-CM

## 2019-02-16 DIAGNOSIS — Z87898 Personal history of other specified conditions: Secondary | ICD-10-CM

## 2019-02-16 DIAGNOSIS — Z130 Encounter for screening for diseases of the blood and blood-forming organs and certain disorders involving the immune mechanism: Secondary | ICD-10-CM

## 2019-02-16 DIAGNOSIS — Z13220 Encounter for screening for lipoid disorders: Secondary | ICD-10-CM | POA: Diagnosis not present

## 2019-02-16 DIAGNOSIS — Z13228 Encounter for screening for other metabolic disorders: Secondary | ICD-10-CM

## 2019-02-16 DIAGNOSIS — Z0000 Encounter for general adult medical examination without abnormal findings: Secondary | ICD-10-CM | POA: Diagnosis not present

## 2019-02-16 DIAGNOSIS — R7309 Other abnormal glucose: Secondary | ICD-10-CM | POA: Diagnosis not present

## 2019-02-17 LAB — HEMOGLOBIN A1C
Hgb A1c MFr Bld: 5.6 % of total Hgb (ref <5.7)
Mean Plasma Glucose: 114 (calc)
eAG (mmol/L): 6.3 (calc)

## 2019-02-17 LAB — CBC WITH DIFFERENTIAL/PLATELET
Absolute Monocytes: 550 cells/uL (ref 200–950)
Basophils Absolute: 39 cells/uL (ref 0–200)
Basophils Relative: 0.7 %
Eosinophils Absolute: 138 cells/uL (ref 15–500)
Eosinophils Relative: 2.5 %
HCT: 46.2 % (ref 38.5–50.0)
Hemoglobin: 16.3 g/dL (ref 13.2–17.1)
Lymphs Abs: 2321 cells/uL (ref 850–3900)
MCH: 32.7 pg (ref 27.0–33.0)
MCHC: 35.3 g/dL (ref 32.0–36.0)
MCV: 92.6 fL (ref 80.0–100.0)
MPV: 11.4 fL (ref 7.5–12.5)
Monocytes Relative: 10 %
Neutro Abs: 2453 cells/uL (ref 1500–7800)
Neutrophils Relative %: 44.6 %
Platelets: 237 10*3/uL (ref 140–400)
RBC: 4.99 10*6/uL (ref 4.20–5.80)
RDW: 12.8 % (ref 11.0–15.0)
Total Lymphocyte: 42.2 %
WBC: 5.5 10*3/uL (ref 3.8–10.8)

## 2019-02-17 LAB — COMPLETE METABOLIC PANEL WITH GFR
AG Ratio: 1.9 (calc) (ref 1.0–2.5)
ALT: 69 U/L — ABNORMAL HIGH (ref 9–46)
AST: 49 U/L — ABNORMAL HIGH (ref 10–40)
Albumin: 5 g/dL (ref 3.6–5.1)
Alkaline phosphatase (APISO): 79 U/L (ref 36–130)
BUN: 16 mg/dL (ref 7–25)
CO2: 24 mmol/L (ref 20–32)
Calcium: 10.4 mg/dL — ABNORMAL HIGH (ref 8.6–10.3)
Chloride: 103 mmol/L (ref 98–110)
Creat: 1.02 mg/dL (ref 0.60–1.35)
GFR, Est African American: 110 mL/min/{1.73_m2} (ref >=60)
GFR, Est Non African American: 95 mL/min/{1.73_m2} (ref >=60)
Globulin: 2.7 g/dL (calc) (ref 1.9–3.7)
Glucose, Bld: 90 mg/dL (ref 65–99)
Potassium: 4.5 mmol/L (ref 3.5–5.3)
Sodium: 140 mmol/L (ref 135–146)
Total Bilirubin: 0.5 mg/dL (ref 0.2–1.2)
Total Protein: 7.7 g/dL (ref 6.1–8.1)

## 2019-02-17 LAB — LIPID PANEL
Cholesterol: 187 mg/dL (ref <200)
HDL: 61 mg/dL (ref >=40)
LDL Cholesterol (Calc): 104 mg/dL (calc) — ABNORMAL HIGH
Non-HDL Cholesterol (Calc): 126 mg/dL (calc) (ref <130)
Total CHOL/HDL Ratio: 3.1 (calc) (ref <5.0)
Triglycerides: 121 mg/dL (ref <150)

## 2019-02-27 ENCOUNTER — Encounter: Payer: Self-pay | Admitting: Family Medicine

## 2019-04-11 DIAGNOSIS — Z20828 Contact with and (suspected) exposure to other viral communicable diseases: Secondary | ICD-10-CM | POA: Diagnosis not present

## 2019-04-13 ENCOUNTER — Ambulatory Visit: Payer: BC Managed Care – PPO | Attending: Internal Medicine

## 2020-02-12 ENCOUNTER — Other Ambulatory Visit: Payer: Self-pay

## 2020-02-12 ENCOUNTER — Encounter: Payer: Self-pay | Admitting: Nurse Practitioner

## 2020-02-12 ENCOUNTER — Ambulatory Visit (INDEPENDENT_AMBULATORY_CARE_PROVIDER_SITE_OTHER): Payer: BC Managed Care – PPO | Admitting: Nurse Practitioner

## 2020-02-12 VITALS — BP 150/90 | HR 83 | Temp 98.3°F | Ht 65.0 in | Wt 180.0 lb

## 2020-02-12 DIAGNOSIS — Z136 Encounter for screening for cardiovascular disorders: Secondary | ICD-10-CM | POA: Diagnosis not present

## 2020-02-12 DIAGNOSIS — Z0001 Encounter for general adult medical examination with abnormal findings: Secondary | ICD-10-CM

## 2020-02-12 DIAGNOSIS — Z13220 Encounter for screening for lipoid disorders: Secondary | ICD-10-CM | POA: Diagnosis not present

## 2020-02-12 DIAGNOSIS — Z1159 Encounter for screening for other viral diseases: Secondary | ICD-10-CM

## 2020-02-12 DIAGNOSIS — Z131 Encounter for screening for diabetes mellitus: Secondary | ICD-10-CM

## 2020-02-12 DIAGNOSIS — Z0000 Encounter for general adult medical examination without abnormal findings: Secondary | ICD-10-CM

## 2020-02-12 DIAGNOSIS — Z6829 Body mass index (BMI) 29.0-29.9, adult: Secondary | ICD-10-CM

## 2020-02-12 DIAGNOSIS — R030 Elevated blood-pressure reading, without diagnosis of hypertension: Secondary | ICD-10-CM

## 2020-02-12 HISTORY — DX: Elevated blood-pressure reading, without diagnosis of hypertension: R03.0

## 2020-02-12 LAB — CBC WITH DIFFERENTIAL/PLATELET
Basophils Relative: 0.7 %
Lymphs Abs: 2101 cells/uL (ref 850–3900)
Neutro Abs: 2608 cells/uL (ref 1500–7800)
RBC: 5.01 10*6/uL (ref 4.20–5.80)

## 2020-02-12 NOTE — Assessment & Plan Note (Signed)
BP elevated on recheck in clinic today.  Encouraged patient to check BP at home and notify us if >130/80 consistently.  Discussed diet and lifestyle changes - smoking cessation, DASH diet, increasing physical activity.  Follow up if BP remains an issue.

## 2020-02-12 NOTE — Progress Notes (Signed)
BP (!) 150/90   Pulse 83   Temp 98.3 F (36.8 C) (Oral)   Ht 5\' 5"  (1.651 m)   Wt 180 lb (81.6 kg)   SpO2 97%   BMI 29.95 kg/m    Subjective:    Patient ID: , male    DOB: Nov 02, 1985, 34 y.o.   MRN: [id number]  HPI: Ronald Combs is a 34 y.o. male presenting on 02/12/2020 for comprehensive medical examination. 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   Interim Problems from his last visit: no  Depression Screen done today and results listed below:  Depression screen [facility] 2/9 02/12/2020 08/25/2018 01/24/2017 01/21/2016  Decreased Interest 0 0 0 0  Down, Depressed, Hopeless 0 0 0 0  PHQ - 2 Score 0 0 0 0  Altered sleeping 0 - - 0  Tired, decreased energy 0 - - 0  Change in appetite 0 - - 0  Feeling bad or failure about yourself  0 - - 0  Trouble concentrating 0 - - 0  Moving slowly or fidgety/restless 0 - - 0  Suicidal thoughts 0 - - 0  PHQ-9 Score 0 - - 0  Difficult doing work/chores Not difficult at all - - -    The patient does not have a history of falls. I did not complete a risk assessment for falls. A plan of care for falls was not documented.   Past Medical History:  No past medical history on file.  Surgical History:  No past surgical history on file.  Medications:  No current outpatient medications on file prior to visit.   No current facility-administered medications on file prior to visit.    Allergies:  No Known Allergies  Social History:  Social History   Socioeconomic History  . Marital status: Significant Other    Spouse name: Not on file  . Number of children: Not on file  . Years of education: Not on file  . Highest education level: Not on file  Occupational History  . Not on file  Tobacco Use  . Smoking status: Former Smoker    Quit date: 03/26/2016    Years since quitting: 3.8  . Smokeless tobacco: Former 03/28/2016 and Sexual Activity  .  Alcohol use: Yes    Alcohol/week: 1.0 standard drink    Types: 1 Cans of beer per week    Comment: occassional  . Drug use: No  . Sexual activity: Yes    Comment: one partner  Other Topics Concern  . Not on file  Social History Narrative  . Not on file   Social Determinants of Health   Financial Resource Strain:   . Difficulty of Paying Living Expenses: Not on file  Food Insecurity:   . Worried About Engineer, water in the Last Year: Not on file  . Ran Out of Food in the Last Year: Not on file  Transportation Needs:   . Lack of Transportation (Medical): Not on file  . Lack of Transportation (Non-Medical): Not on file  Physical Activity:   . Days of Exercise per Week: Not on file  . Minutes of Exercise per Session: Not on file  Stress:   . Feeling of Stress : Not on file  Social Connections:   . Frequency of Communication with Friends and Family: Not on file  . Frequency of Social Gatherings with Friends and Family: Not on file  .  Attends Religious Services: Not on file  . Active Member of Clubs or Organizations: Not on file  . Attends Banker Meetings: Not on file  . Marital Status: Not on file  Intimate Partner Violence:   . Fear of Current or Ex-Partner: Not on file  . Emotionally Abused: Not on file  . Physically Abused: Not on file  . Sexually Abused: Not on file   Social History   Tobacco Use  Smoking Status Former Smoker  . Quit date: 03/26/2016  . Years since quitting: 3.8  Smokeless Tobacco Former Neurosurgeon   Social History   Substance and Sexual Activity  Alcohol Use Yes  . Alcohol/week: 1.0 standard drink  . Types: 1 Cans of beer per week   Comment: occassional    Family History:  Family History  Problem Relation Age of Onset  . Stroke Father 76    Past medical history, surgical history, medications, allergies, family history and social history reviewed with patient today and changes made to appropriate areas of the chart.   Review  of Systems  Constitutional: Negative.  Negative for fever and weight loss.  HENT: Negative.  Negative for ear pain, sinus pain, sore throat and tinnitus.   Eyes: Negative.  Negative for blurred vision and double vision.  Respiratory: Negative.  Negative for cough, shortness of breath and wheezing.   Cardiovascular: Negative.  Negative for chest pain, palpitations and leg swelling.  Gastrointestinal: Negative.  Negative for abdominal pain, blood in stool, constipation, diarrhea, heartburn, nausea and vomiting.  Genitourinary: Negative.  Negative for dysuria, frequency and urgency.  Musculoskeletal: Negative.  Negative for joint pain and myalgias.  Skin: Negative.  Negative for itching and rash.  Neurological: Negative.  Negative for dizziness, tingling, weakness and headaches.  Endo/Heme/Allergies: Negative.   Psychiatric/Behavioral: Negative.  Negative for depression, substance abuse and suicidal ideas. The patient is not nervous/anxious and does not have insomnia.        Objective:    BP (!) 150/90   Pulse 83   Temp 98.3 F (36.8 C) (Oral)   Ht 5\' 5"  (1.651 m)   Wt 180 lb (81.6 kg)   SpO2 97%   BMI 29.95 kg/m   Wt Readings from Last 3 Encounters:  02/12/20 180 lb (81.6 kg)  08/25/18 172 lb (78 kg)  08/28/17 176 lb (79.8 kg)    Physical Exam Vitals and nursing note reviewed.  Constitutional:      General: He is not in acute distress.    Appearance: Normal appearance. He is normal weight. He is not toxic-appearing.  HENT:     Head: Normocephalic and atraumatic.     Right Ear: Tympanic membrane, ear canal and external ear normal.     Left Ear: Tympanic membrane, ear canal and external ear normal.     Nose: Nose normal. No congestion or rhinorrhea.     Mouth/Throat:     Mouth: Mucous membranes are moist.     Pharynx: Oropharynx is clear. No oropharyngeal exudate or posterior oropharyngeal erythema.  Eyes:     General: No scleral icterus.       Right eye: No discharge.          Left eye: No discharge.     Extraocular Movements: Extraocular movements intact.     Pupils: Pupils are equal, round, and reactive to light.  Neck:     Vascular: No carotid bruit.  Cardiovascular:     Rate and Rhythm: Normal rate and regular rhythm.  Heart sounds: Normal heart sounds. No murmur heard.  No gallop.   Pulmonary:     Effort: Pulmonary effort is normal. No respiratory distress.     Breath sounds: Normal breath sounds. No wheezing or rhonchi.  Abdominal:     General: Abdomen is flat. Bowel sounds are normal. There is no distension.     Palpations: Abdomen is soft.     Tenderness: There is no abdominal tenderness.  Genitourinary:    Comments: Deferred using shared decision making Musculoskeletal:        General: No swelling or tenderness. Normal range of motion.     Cervical back: Normal range of motion and neck supple. No tenderness.     Right lower leg: No edema.     Left lower leg: No edema.  Skin:    General: Skin is warm and dry.     Capillary Refill: Capillary refill takes less than 2 seconds.     Coloration: Skin is not jaundiced.     Findings: No lesion.  Neurological:     General: No focal deficit present.     Mental Status: He is alert and oriented to person, place, and time. Mental status is at baseline.     Motor: No weakness.     Gait: Gait normal.  Psychiatric:        Mood and Affect: Mood normal.        Behavior: Behavior normal.        Thought Content: Thought content normal.        Judgment: Judgment normal.        Assessment & Plan:   Problem List Items Addressed This Visit      Other   Elevated BP without diagnosis of hypertension    BP elevated on recheck in clinic today.  Encouraged patient to check BP at home and notify us if >130/80 consistently.  Discussed diet and lifestyle changes - smoking cessation, DASH diet, increasing physical activity.  Follow up if BP remains an issue.       Other Visit Diagnoses    Annual  physical exam    -  Primary   Relevant Orders   Hepatitis C antibody   Hemoglobin A1c   Lipid Panel   CBC with Differential   Comprehensive metabolic panel   Encounter for lipid screening for cardiovascular disease       Relevant Orders   Lipid Panel   CBC with Differential   Comprehensive metabolic panel   Screening for diabetes mellitus (DM)       Relevant Orders   Hemoglobin A1c   BMI 29.0-29.9,adult       Relevant Orders   Hemoglobin A1c   CBC with Differential   Comprehensive metabolic panel   Need for hepatitis C screening test       Relevant Orders   Hepatitis C antibody       Discussed aspirin prophylaxis for myocardial infarction prevention and decision was it was not indicated  LABORATORY TESTING:  Health maintenance labs ordered today as discussed above.   The natural history of prostate cancer and ongoing controversy regarding screening and potential treatment outcomes of prostate cancer has been discussed with the patient. The meaning of a false positive PSA and a false negative PSA has been discussed. He indicates understanding of the limitations of this screening test and wishes not to proceed with screening PSA testing.   IMMUNIZATIONS:   - Tdap: Tetanus vaccination status reviewed: last tetanus booster within 10  years. - Influenza: Refused - Pneumovax: Not applicable - Prevnar: Given elsewhere - HPV: Not applicable - Zostavax vaccine: Not applicable - COVID-19 vaccine: planning to get later this month.  SCREENING: - Colonoscopy: Not applicable  Discussed with patient purpose of the colonoscopy is to detect colon cancer at curable precancerous or early stages   - AAA Screening: Not applicable  -Hearing Test: Not applicable  -Spirometry: Not applicable   PATIENT COUNSELING:    Sexuality: Discussed sexually transmitted diseases, partner selection, use of condoms, avoidance of unintended pregnancy  and contraceptive alternatives.   Advised to avoid  cigarette smoking.  I discussed with the patient that most people either abstain from alcohol or drink within safe limits (<=14/week and <=4 drinks/occasion for males, <=7/weeks and <= 3 drinks/occasion for females) and that the risk for alcohol disorders and other health effects rises proportionally with the number of drinks per week and how often a drinker exceeds daily limits.  Discussed cessation/primary prevention of drug use and availability of treatment for abuse.   Diet: Encouraged to adjust caloric intake to maintain  or achieve ideal body weight, to reduce intake of dietary saturated fat and total fat, to limit sodium intake by avoiding high sodium foods and not adding table salt, and to maintain adequate dietary potassium and calcium preferably from fresh fruits, vegetables, and low-fat dairy products.    stressed the importance of regular exercise  Injury prevention: Discussed safety belts, safety helmets, smoke detector, smoking near bedding or upholstery.   Dental health: Discussed importance of regular tooth brushing, flossing, and dental visits.   Follow up plan: NEXT PREVENTATIVE PHYSICAL DUE IN 1 YEAR. Return in about 1 year (around 02/11/2021) for CPE with fasting labs, sooner if BP remains elevated.

## 2020-02-12 NOTE — Patient Instructions (Signed)
Ronald Combs,  We will send your lab results to you in the mail.  Please check your blood pressure at home.  If it is higher than 130/80 consistently, be sure to come back and see [place].  Preventive Care 82-36 Years Old, Male Preventive care refers to lifestyle choices and visits with your health care provider that can promote health and wellness. This includes:  A yearly physical exam. This is also called an annual well check.  Regular dental and eye exams.  Immunizations.  Screening for certain conditions.  Healthy lifestyle choices, such as eating a healthy diet, getting regular exercise, not using drugs or products that contain nicotine and tobacco, and limiting alcohol use. What can I expect for my preventive care visit? Physical exam Your health care provider will check:  Height and weight. These may be used to calculate body mass index (BMI), which is a measurement that tells if you are at a healthy weight.  Heart rate and blood pressure.  Your skin for abnormal spots. Counseling Your health care provider may ask you questions about:  Alcohol, tobacco, and drug use.  Emotional well-being.  Home and relationship well-being.  Sexual activity.  Eating habits.  Work and work Statistician. What immunizations do I need?  Influenza (flu) vaccine  This is recommended every year. Tetanus, diphtheria, and pertussis (Tdap) vaccine  You may need a Td booster every 10 years. Varicella (chickenpox) vaccine  You may need this vaccine if you have not already been vaccinated. Human papillomavirus (HPV) vaccine  If recommended by your health care provider, you may need three doses over 6 months. Measles, mumps, and rubella (MMR) vaccine  You may need at least one dose of MMR. You may also need a second dose. Meningococcal conjugate (MenACWY) vaccine  One dose is recommended if you are 41-12 years old and a Market researcher living in a residence hall, or if you have one  of several medical conditions. You may also need additional booster doses. Pneumococcal conjugate (PCV13) vaccine  You may need this if you have certain conditions and were not previously vaccinated. Pneumococcal polysaccharide (PPSV23) vaccine  You may need one or two doses if you smoke cigarettes or if you have certain conditions. Hepatitis A vaccine  You may need this if you have certain conditions or if you travel or work in places where you may be exposed to hepatitis A. Hepatitis B vaccine  You may need this if you have certain conditions or if you travel or work in places where you may be exposed to hepatitis B. Haemophilus influenzae type b (Hib) vaccine  You may need this if you have certain risk factors. You may receive vaccines as individual doses or as more than one vaccine together in one shot (combination vaccines). Talk with your health care provider about the risks and benefits of combination vaccines. What tests do I need? Blood tests  Lipid and cholesterol levels. These may be checked every 5 years starting at age 44.  Hepatitis C test.  Hepatitis B test. Screening   Diabetes screening. This is done by checking your blood sugar (glucose) after you have not eaten for a while (fasting).  Sexually transmitted disease (STD) testing. Talk with your health care provider about your test results, treatment options, and if necessary, the need for more tests. Follow these instructions at home: Eating and drinking   Eat a diet that includes fresh fruits and vegetables, whole grains, lean protein, and low-fat dairy products.  Take vitamin  and mineral supplements as recommended by your health care provider.  Do not drink alcohol if your health care provider tells you not to drink.  If you drink alcohol: ? Limit how much you have to 0-2 drinks a day. ? Be aware of how much alcohol is in your drink. In the [place] one drink equals one 12 oz bottle of beer (355 mL), one 5  oz glass of wine (148 mL), or one 1 oz glass of hard liquor (44 mL). Lifestyle  Take daily care of your teeth and gums.  Stay active. Exercise for at least 30 minutes on 5 or more days each week.  Do not use any products that contain nicotine or tobacco, such as cigarettes, e-cigarettes, and chewing tobacco. If you need help quitting, ask your health care provider.  If you are sexually active, practice safe sex. Use a condom or other form of protection to prevent STIs (sexually transmitted infections). What's next?  Go to your health care provider once a year for a well check visit.  Ask your health care provider how often you should have your eyes and teeth checked.  Stay up to date on all vaccines. This information is not intended to replace advice given to you by your health care provider. Make sure you discuss any questions you have with your health care provider. Document Revised: 02/16/2018 Document Reviewed: 02/16/2018 Elsevier Patient Education  2020 [employer].

## 2020-02-13 ENCOUNTER — Encounter: Payer: Self-pay | Admitting: Nurse Practitioner

## 2020-02-13 DIAGNOSIS — R7303 Prediabetes: Secondary | ICD-10-CM | POA: Insufficient documentation

## 2020-02-13 DIAGNOSIS — E781 Pure hyperglyceridemia: Secondary | ICD-10-CM | POA: Insufficient documentation

## 2020-02-13 HISTORY — DX: Pure hyperglyceridemia: E78.1

## 2020-02-13 LAB — COMPREHENSIVE METABOLIC PANEL
AG Ratio: 1.6 (calc) (ref 1.0–2.5)
ALT: 55 U/L — ABNORMAL HIGH (ref 9–46)
AST: 32 U/L (ref 10–40)
Albumin: 4.7 g/dL (ref 3.6–5.1)
Alkaline phosphatase (APISO): 85 U/L (ref 36–130)
BUN: 13 mg/dL (ref 7–25)
CO2: 22 mmol/L (ref 20–32)
Calcium: 10.2 mg/dL (ref 8.6–10.3)
Chloride: 101 mmol/L (ref 98–110)
Creat: 1 mg/dL (ref 0.60–1.35)
Globulin: 2.9 g/dL (calc) (ref 1.9–3.7)
Glucose, Bld: 105 mg/dL — ABNORMAL HIGH (ref 65–99)
Potassium: 4.5 mmol/L (ref 3.5–5.3)
Sodium: 138 mmol/L (ref 135–146)
Total Bilirubin: 0.7 mg/dL (ref 0.2–1.2)
Total Protein: 7.6 g/dL (ref 6.1–8.1)

## 2020-02-13 LAB — CBC WITH DIFFERENTIAL/PLATELET
Absolute Monocytes: 535 cells/uL (ref 200–950)
Basophils Absolute: 38 cells/uL (ref 0–200)
Eosinophils Absolute: 119 cells/uL (ref 15–500)
Eosinophils Relative: 2.2 %
HCT: 45.7 % (ref 38.5–50.0)
Hemoglobin: 15.9 g/dL (ref 13.2–17.1)
MCH: 31.7 pg (ref 27.0–33.0)
MCHC: 34.8 g/dL (ref 32.0–36.0)
MCV: 91.2 fL (ref 80.0–100.0)
MPV: 11 fL (ref 7.5–12.5)
Monocytes Relative: 9.9 %
Neutrophils Relative %: 48.3 %
Platelets: 246 10*3/uL (ref 140–400)
RDW: 13 % (ref 11.0–15.0)
Total Lymphocyte: 38.9 %
WBC: 5.4 10*3/uL (ref 3.8–10.8)

## 2020-02-13 LAB — HEMOGLOBIN A1C
Hgb A1c MFr Bld: 5.7 % of total Hgb — ABNORMAL HIGH (ref <5.7)
Mean Plasma Glucose: 117 mg/dL
eAG (mmol/L): 6.5 mmol/L

## 2020-02-13 LAB — LIPID PANEL
Cholesterol: 184 mg/dL (ref <200)
HDL: 52 mg/dL (ref >=40)
LDL Cholesterol (Calc): 92 mg/dL (calc)
Non-HDL Cholesterol (Calc): 132 mg/dL (calc) — ABNORMAL HIGH (ref <130)
Total CHOL/HDL Ratio: 3.5 (calc) (ref <5.0)
Triglycerides: 278 mg/dL — ABNORMAL HIGH (ref <150)

## 2020-02-13 LAB — HEPATITIS C ANTIBODY
Hepatitis C Ab: NONREACTIVE
SIGNAL TO CUT-OFF: 0.04 (ref <1.00)

## 2020-03-30 DIAGNOSIS — Z20822 Contact with and (suspected) exposure to covid-19: Secondary | ICD-10-CM | POA: Diagnosis not present

## 2020-05-26 ENCOUNTER — Encounter: Payer: Self-pay | Admitting: Family Medicine

## 2020-05-26 ENCOUNTER — Other Ambulatory Visit: Payer: Self-pay

## 2020-05-26 ENCOUNTER — Ambulatory Visit (INDEPENDENT_AMBULATORY_CARE_PROVIDER_SITE_OTHER): Payer: BC Managed Care – PPO | Admitting: Nurse Practitioner

## 2020-05-26 VITALS — BP 138/64 | HR 69 | Temp 98.8°F | Ht 65.0 in | Wt 181.0 lb

## 2020-05-26 DIAGNOSIS — M79644 Pain in right finger(s): Secondary | ICD-10-CM | POA: Diagnosis not present

## 2020-05-26 DIAGNOSIS — L03011 Cellulitis of right finger: Secondary | ICD-10-CM

## 2020-05-26 DIAGNOSIS — R300 Dysuria: Secondary | ICD-10-CM | POA: Diagnosis not present

## 2020-05-26 MED ORDER — SULFAMETHOXAZOLE-TRIMETHOPRIM 800-160 MG PO TABS: 1.0000 | ORAL_TABLET | Freq: Two times a day (BID) | ORAL | 0 refills | Status: DC

## 2020-05-26 NOTE — Patient Instructions (Signed)
F/u 12 noon tomorrow; give note for work tomorrow

## 2020-05-26 NOTE — Progress Notes (Signed)
Subjective:    Patient ID: Ronald Combs, male    DOB: June 30, 1985, 35 y.o.   MRN: [id number]  HPI: Ronald Combs is a 35 y.o. male presenting for swelling and pain in right pointer finger.  Chief Complaint  Patient presents with  . swelling    Right index finger swollen for the past 3 day does not know onset, takes no meds   FINGER PAIN Duration: weeks Involved finger: second finger on right hand Mechanism of injury: no injury Location: right index finger around nail bed Onset: comes and goes Severity: ranges mild to severe  Quality: sharp Frequency: intermittent Radiation: no Aggravating factors: bumping it, touching it Alleviating factors: Aleve Treatments attempted: Aleve Relief with NSAIDs?: yes Weakness: no Numbness: no Redness: yes Swelling:yes Bruising: no Fevers: no  No Known Allergies  Outpatient Encounter Medications as of 05/26/2020  Medication Sig  . sulfamethoxazole-trimethoprim (BACTRIM DS) 800-160 MG tablet Take 1 tablet by mouth 2 (two) times daily.   No facility-administered encounter medications on file as of 05/26/2020.    Patient Active Problem List   Diagnosis Date Noted  . Prediabetes 02/13/2020  . High triglycerides 02/13/2020  . Elevated BP without diagnosis of hypertension 02/12/2020    Past Medical History:  Diagnosis Date  . High triglycerides 02/13/2020    Relevant past medical, surgical, family and social history reviewed and updated as indicated. Interim medical history since our last visit reviewed.  Review of Systems Per HPI unless specifically indicated above     Objective:    BP 138/64   Pulse 69   Temp 98.8 F (37.1 C)   Ht 5\' 5"  (1.651 m)   Wt 181 lb (82.1 kg)   SpO2 96%   BMI 30.12 kg/m   Wt Readings from Last 3 Encounters:  05/26/20 181 lb (82.1 kg)  02/12/20 180 lb (81.6 kg)  08/25/18 172 lb (78 kg)    Physical Exam Vitals and nursing note reviewed.  Constitutional:      General: He is not in  acute distress.    Appearance: Normal appearance. He is not toxic-appearing.  Musculoskeletal:     Right hand: Swelling present.     Left hand: Normal.     Comments: Significant swelling with fluctuance to second finger on right hand proximal to nail fold consistent with abscess.  Skin:    General: Skin is warm and dry.     Coloration: Skin is not jaundiced or pale.     Findings: No erythema.  Neurological:     Mental Status: He is alert and oriented to person, place, and time.     Motor: No weakness.     Gait: Gait normal.  Psychiatric:        Mood and Affect: Mood normal.        Behavior: Behavior normal.        Thought Content: Thought content normal.        Judgment: Judgment normal.       Assessment & Plan:  1. Paronychia of right index finger Acute.  I&D of abscess performed - see procedure note below.  Discussed wound care - not to get finger wet, do not remove bandage until follow up.  Out of work tomorrow.  Start antibiotics to cover for MRSA.  Follow up tomorrow.  - WOUND CULTURE - sulfamethoxazole-trimethoprim (BACTRIM DS) 800-160 MG tablet; Take 1 tablet by mouth 2 (two) times daily.  Dispense: 14 tablet; Refill: 0    Incision  and Drainage of Paronychia of right index finger  Procedure explained to patient questions answered benefits and risks discussed verbal consent obtained.  Anesthesia/finger block - 2 cc lidocaine injected into base of right index finger. Straight incision performed using scalpel.  Moderate amount of blood and pus expressed.  Hemostats used to express pus material.   Wound culture taken, labeled, and sent to lab. Finger cleansed in hydrogen peroxide.  Hemostasis achieved using pressure bandage. EBL 5 mL Patient tolerated procedure well.  Wound care discussed and bandage applied.  Not to remove bandage until follow up tomorrow.    Follow up plan: Return in about 1 day (around 05/27/2020) for paronychia follow up.

## 2020-05-27 ENCOUNTER — Encounter: Payer: Self-pay | Admitting: Family Medicine

## 2020-05-27 ENCOUNTER — Ambulatory Visit (INDEPENDENT_AMBULATORY_CARE_PROVIDER_SITE_OTHER): Payer: BC Managed Care – PPO | Admitting: Nurse Practitioner

## 2020-05-27 ENCOUNTER — Encounter: Payer: Self-pay | Admitting: Nurse Practitioner

## 2020-05-27 VITALS — BP 160/100 | HR 109 | Temp 98.6°F | Ht 65.0 in | Wt 181.0 lb

## 2020-05-27 DIAGNOSIS — L03011 Cellulitis of right finger: Secondary | ICD-10-CM

## 2020-05-27 LAB — WOUND CULTURE: SPECIMEN QUALITY:: ADEQUATE

## 2020-05-27 MED ORDER — IBUPROFEN 800 MG PO TABS: 800.0000 mg | ORAL_TABLET | Freq: Three times a day (TID) | ORAL | 0 refills | Status: DC | PRN

## 2020-05-27 NOTE — Progress Notes (Signed)
Subjective:    Patient ID: Ronald Combs, male    DOB: November 26, 1985, 35 y.o.   MRN: [id number]  HPI: Ronald Combs is a 35 y.o. male presenting for finger follow up.  Chief Complaint  Patient presents with  . Follow-up    Paronychia follow up right index finger   PARONYCHIA/CELLULITIS OF RIGHT FINGER Patient reports that he had to loosen the bandage last night, it was too tight.  He reports he is in significant pain.  Has been taking Tylenol and this has been helping "a little".  Did not remember to start the antibiotic last night.  Has not noticed any bleeding/oozing.  Denies swelling distal to the area of infection, however does report pain near the site of injection for the finger block.  Denies fever, worsening swelling, drainage, oozing from wound.  No Known Allergies  Outpatient Encounter Medications as of 05/27/2020  Medication Sig  . ibuprofen (ADVIL) 800 MG tablet Take 1 tablet (800 mg total) by mouth every 8 (eight) hours as needed. Take with food  . sulfamethoxazole-trimethoprim (BACTRIM DS) 800-160 MG tablet Take 1 tablet by mouth 2 (two) times daily.   No facility-administered encounter medications on file as of 05/27/2020.    Patient Active Problem List   Diagnosis Date Noted  . Prediabetes 02/13/2020  . High triglycerides 02/13/2020  . Elevated BP without diagnosis of hypertension 02/12/2020    Past Medical History:  Diagnosis Date  . High triglycerides 02/13/2020    Relevant past medical, surgical, family and social history reviewed and updated as indicated. Interim medical history since our last visit reviewed.  Review of Systems Per HPI unless specifically indicated above     Objective:    BP (!) 160/100   Pulse (!) 109   Temp 98.6 F (37 C)   Ht 5\' 5"  (1.651 m)   Wt 181 lb (82.1 kg)   SpO2 100%   BMI 30.12 kg/m   Wt Readings from Last 3 Encounters:  05/27/20 181 lb (82.1 kg)  05/26/20 181 lb (82.1 kg)  02/12/20 180 lb (81.6 kg)     Physical Exam Vitals and nursing note reviewed.  Constitutional:      General: He is not in acute distress.    Appearance: Normal appearance. He is not toxic-appearing.  Musculoskeletal:     Right hand: Swelling present.     Left hand: Normal.  Skin:    General: Skin is warm and dry.     Coloration: Skin is not jaundiced or pale.     Findings: No erythema.     Comments: Swelling to right index finger reduced from yesterday.  Finger is nontender to palpation; no redness, warmth, pus, or odor to wound.   Neurological:     Mental Status: He is alert and oriented to person, place, and time.     Motor: No weakness.     Gait: Gait normal.  Psychiatric:        Mood and Affect: Mood normal.        Behavior: Behavior normal.        Thought Content: Thought content normal.        Judgment: Judgment normal.       Assessment & Plan:  1. Paronychia of right index finger Acute.  Improved slightly.  I was able to excrete a small amount of blood from the incision with pressure; no pus or odor noted.  Wound dressed with petroleum gauze and coban.  Discussed wound  care - do not get wet or remove bandage.  Again, encouraged to start taking Bactrim ASAP; reiterated that this is a bacterial infection which needs antibiotics to resolve.  Can alternate Advil with Tylenol to help with pain relief.  Follow up tomorrow, to remain out of work.    - ibuprofen (ADVIL) 800 MG tablet; Take 1 tablet (800 mg total) by mouth every 8 (eight) hours as needed. Take with food  Dispense: 30 tablet; Refill: 0    Follow up plan: Return in about 1 day (around 05/28/2020) for finger follow up.

## 2020-05-28 ENCOUNTER — Ambulatory Visit: Payer: BC Managed Care – PPO | Admitting: Family Medicine

## 2020-05-28 ENCOUNTER — Other Ambulatory Visit: Payer: Self-pay

## 2020-05-28 ENCOUNTER — Ambulatory Visit (INDEPENDENT_AMBULATORY_CARE_PROVIDER_SITE_OTHER): Payer: BC Managed Care – PPO | Admitting: Nurse Practitioner

## 2020-05-28 ENCOUNTER — Ambulatory Visit (HOSPITAL_COMMUNITY)
Admission: RE | Admit: 2020-05-28 | Discharge: 2020-05-28 | Disposition: A | Discharge status: Home / Self Care | Payer: BC Managed Care – PPO | Source: Ambulatory Visit | Attending: Nurse Practitioner | Admitting: Nurse Practitioner

## 2020-05-28 VITALS — BP 150/100 | HR 72 | Temp 98.6°F

## 2020-05-28 DIAGNOSIS — L03011 Cellulitis of right finger: Secondary | ICD-10-CM | POA: Diagnosis not present

## 2020-05-28 DIAGNOSIS — M7989 Other specified soft tissue disorders: Secondary | ICD-10-CM | POA: Diagnosis not present

## 2020-05-28 NOTE — Progress Notes (Signed)
Subjective:    Patient ID: Ronald Combs, male    DOB: 08/27/1985, 35 y.o.   MRN: [id number]  HPI: Ronald Combs is a 35 y.o. male presenting for finger follow up.  Chief Complaint  Patient presents with  . Follow-up   PARONYCHIA/CELLULITIS OF RIGHT FINGER Patient reports that ibuprofen helped greatly with his finger.  He is not in any pain today.  Bandage got a little bit loose, but overall he thinks his finger is doing well.  He started the antibiotic last night and is tolerating this well.  Denies swelling, pain, numbness or tingling, fevers, or drainage.  No Known Allergies  Outpatient Encounter Medications as of 05/28/2020  Medication Sig  . ibuprofen (ADVIL) 800 MG tablet Take 1 tablet (800 mg total) by mouth every 8 (eight) hours as needed. Take with food  . sulfamethoxazole-trimethoprim (BACTRIM DS) 800-160 MG tablet Take 1 tablet by mouth 2 (two) times daily.   No facility-administered encounter medications on file as of 05/28/2020.    Patient Active Problem List   Diagnosis Date Noted  . Prediabetes 02/13/2020  . High triglycerides 02/13/2020  . Elevated BP without diagnosis of hypertension 02/12/2020    Past Medical History:  Diagnosis Date  . High triglycerides 02/13/2020    Relevant past medical, surgical, family and social history reviewed and updated as indicated. Interim medical history since our last visit reviewed.  Review of Systems Per HPI unless specifically indicated above     Objective:    BP (!) 150/100 (BP Location: Left Arm, Patient Position: Sitting)   Pulse 72   Temp 98.6 F (37 C) (Oral)   SpO2 97%   Wt Readings from Last 3 Encounters:  05/27/20 181 lb (82.1 kg)  05/26/20 181 lb (82.1 kg)  02/12/20 180 lb (81.6 kg)    Physical Exam Vitals and nursing note reviewed.  Constitutional:      General: He is not in acute distress.    Appearance: Normal appearance. He is not toxic-appearing.  Musculoskeletal:     Right hand:  Swelling present.     Left hand: Normal.  Skin:    General: Skin is warm and dry.     Coloration: Skin is not jaundiced or pale.     Findings: No erythema.     Comments: Swelling to right index finger significantly reduced.  Finger is nontender to palpation; no redness, warmth, pus, or odor to wound.   Neurological:     Mental Status: He is alert and oriented to person, place, and time.     Motor: No weakness.     Gait: Gait normal.  Psychiatric:        Mood and Affect: Mood normal.        Behavior: Behavior normal.        Thought Content: Thought content normal.        Judgment: Judgment normal.       Assessment & Plan:  1. Paronychia of right index finger Acute, improving.  Upon manipulation today, no drainage was excreted from incision.  Incision has started to close.  Wound cleaned with normal saline, dressed with gauze and wrapped in Coban.  Will obtain finger x-ray today to rule out osteomyelitis of finger.  Advised to wear bandage for 24 hours, then okay to wear a Band-Aid over finger.  Okay to return to work.  Continue antibiotics and await results of x-ray to determine further steps if indicated.     Follow up  plan: Return in about 2 days (around 05/30/2020) for Finger pain follow-up.

## 2020-05-29 LAB — WOUND CULTURE: MICRO NUMBER:: 11671270

## 2020-05-30 ENCOUNTER — Other Ambulatory Visit: Payer: Self-pay

## 2020-05-30 ENCOUNTER — Ambulatory Visit (INDEPENDENT_AMBULATORY_CARE_PROVIDER_SITE_OTHER): Payer: BC Managed Care – PPO | Admitting: Nurse Practitioner

## 2020-05-30 ENCOUNTER — Encounter: Payer: Self-pay | Admitting: Nurse Practitioner

## 2020-05-30 VITALS — BP 152/88 | HR 87 | Temp 98.5°F | Ht 65.0 in | Wt 181.0 lb

## 2020-05-30 DIAGNOSIS — L03011 Cellulitis of right finger: Secondary | ICD-10-CM

## 2020-05-30 NOTE — Progress Notes (Signed)
Subjective:    Patient ID: Ronald Combs, male    DOB: 1986/01/27, 35 y.o.   MRN: [id number]  HPI: Ronald Combs is a 35 y.o. male presenting for finger pain follow up.  Chief Complaint  Patient presents with  . Follow-up    Finger is doing much better, preparing to stop taking pain pills. Continuing the antibiotic   Patient reports his finger is doing much better.  He has been keeping his finger wrapped while at work.  He has been taking the ibuprofen only once per day.  He denies drainage, oozing, swelling, redness, or odor coming from the wound.  No fevers, nausea/vomiting, palpitations.  No Known Allergies  Outpatient Encounter Medications as of 05/30/2020  Medication Sig  . ibuprofen (ADVIL) 800 MG tablet Take 1 tablet (800 mg total) by mouth every 8 (eight) hours as needed. Take with food  . sulfamethoxazole-trimethoprim (BACTRIM DS) 800-160 MG tablet Take 1 tablet by mouth 2 (two) times daily.   No facility-administered encounter medications on file as of 05/30/2020.    Patient Active Problem List   Diagnosis Date Noted  . Prediabetes 02/13/2020  . High triglycerides 02/13/2020  . Elevated BP without diagnosis of hypertension 02/12/2020    Past Medical History:  Diagnosis Date  . High triglycerides 02/13/2020    Relevant past medical, surgical, family and social history reviewed and updated as indicated. Interim medical history since our last visit reviewed.  Review of Systems Per HPI unless specifically indicated above     Objective:    BP (!) 152/88   Pulse 87   Temp 98.5 F (36.9 C)   Ht 5\' 5"  (1.651 m)   Wt 181 lb (82.1 kg)   SpO2 100%   BMI 30.12 kg/m   Wt Readings from Last 3 Encounters:  05/30/20 181 lb (82.1 kg)  05/27/20 181 lb (82.1 kg)  05/26/20 181 lb (82.1 kg)    Physical Exam Vitals and nursing note reviewed.  Constitutional:      General: He is not in acute distress.    Appearance: Normal appearance. He is not toxic-appearing.   Musculoskeletal:     Right hand: Swelling present.     Left hand: Normal.  Skin:    General: Skin is warm and dry.     Coloration: Skin is not jaundiced or pale.     Findings: No erythema.     Comments: Swelling to right index finger almost resolved.  Finger is nontender to palpation; no redness, warmth, pus, or odor to wound.   Neurological:     Mental Status: He is alert and oriented to person, place, and time.     Motor: No weakness.     Gait: Gait normal.  Psychiatric:        Mood and Affect: Mood normal.        Behavior: Behavior normal.        Thought Content: Thought content normal.        Judgment: Judgment normal.    Results for orders placed or performed in visit on 05/26/20  WOUND CULTURE   Specimen: Wound  Result Value Ref Range   MICRO NUMBER: 05/28/20    SPECIMEN QUALITY: Adequate    SOURCE: WOUND (SITE NOT SPECIFIED)    STATUS: FINAL    GRAM STAIN:      Rare Polymorphonuclear leukocytes Rare epithelial cells No organisms seen   ISOLATE 1: Staphylococcus aureus (A)       Susceptibility  Staphylococcus aureus - AEROBIC CULT, GRAM STAIN POSITIVE 1    VANCOMYCIN 1 Sensitive     CIPROFLOXACIN <=0.5 Sensitive     CLINDAMYCIN <=0.25 Sensitive     LEVOFLOXACIN <=0.12 Sensitive     ERYTHROMYCIN <=0.25 Sensitive     GENTAMICIN <=0.5 Sensitive     OXACILLIN* 0.5 Sensitive      * Oxacillin-susceptible staphylococci aresusceptible to other penicillinase-stablepenicillins (e.g. Methicillin, Nafcillin), beta-lactam/beta-lactamase inhibitor combinations, andcephems with staphylococcal indications, includingCefazolin.    TETRACYCLINE <=1 Sensitive     TRIMETH/SULFA* <=10 Sensitive      * Oxacillin-susceptible staphylococci aresusceptible to other penicillinase-stablepenicillins (e.g. Methicillin, Nafcillin), beta-lactam/beta-lactamase inhibitor combinations, andcephems with staphylococcal indications, includingCefazolin.Legend:S = Susceptible  I = IntermediateR = Resistant   NS = Not susceptible* = Not tested  NR = Not reported**NN = See antimicrobic comments      Assessment & Plan:  1. Paronychia of right index finger Acute.  Reviewed finger x-ray with patient - no evidence of bone infection.  Wound culture showed staphylococcus aureus as suspected, susceptible to Bactrim.  Continue full course of antibiotics.  If swelling not almost all of the way resolved near the end of antibiotics, patient to let us know, will extend course for 3 more days.  Continue great wound care.  Follow up with any worsening symptoms or any concerns.   Follow up plan: Return if symptoms worsen or fail to improve.

## 2021-02-12 ENCOUNTER — Ambulatory Visit (INDEPENDENT_AMBULATORY_CARE_PROVIDER_SITE_OTHER): Payer: BC Managed Care – PPO | Admitting: Nurse Practitioner

## 2021-02-12 ENCOUNTER — Encounter: Payer: Self-pay | Admitting: Nurse Practitioner

## 2021-02-12 ENCOUNTER — Other Ambulatory Visit: Payer: Self-pay

## 2021-02-12 VITALS — BP 122/78 | HR 66 | Ht 65.0 in | Wt 170.0 lb

## 2021-02-12 DIAGNOSIS — Z130 Encounter for screening for diseases of the blood and blood-forming organs and certain disorders involving the immune mechanism: Secondary | ICD-10-CM | POA: Diagnosis not present

## 2021-02-12 DIAGNOSIS — Z0000 Encounter for general adult medical examination without abnormal findings: Secondary | ICD-10-CM | POA: Diagnosis not present

## 2021-02-12 DIAGNOSIS — F17290 Nicotine dependence, other tobacco product, uncomplicated: Secondary | ICD-10-CM

## 2021-02-12 DIAGNOSIS — R7303 Prediabetes: Secondary | ICD-10-CM | POA: Diagnosis not present

## 2021-02-12 DIAGNOSIS — E781 Pure hyperglyceridemia: Secondary | ICD-10-CM | POA: Diagnosis not present

## 2021-02-12 DIAGNOSIS — Z136 Encounter for screening for cardiovascular disorders: Secondary | ICD-10-CM | POA: Diagnosis not present

## 2021-02-12 DIAGNOSIS — Z13220 Encounter for screening for lipoid disorders: Secondary | ICD-10-CM | POA: Diagnosis not present

## 2021-02-12 DIAGNOSIS — Z13228 Encounter for screening for other metabolic disorders: Secondary | ICD-10-CM

## 2021-02-12 NOTE — Assessment & Plan Note (Signed)
Chronic.  Will check lipid panel although patient is not currently fasting.  Follow up pending results.

## 2021-02-12 NOTE — Progress Notes (Signed)
BP 122/78   Pulse 66   Ht 5\' 5"  (1.651 m)   Wt 170 lb (77.1 kg)   SpO2 99%   BMI 28.29 kg/m    Subjective:    Patient ID: , male    DOB: Mar 23, 1985, 35 y.o.   MRN: [id number]  HPI: Ronald Combs is a 35 y.o. male presenting on 02/12/2021 for comprehensive medical examination. Current medical complaints include: none  Interim Problems from his last visit: none  Patient has 4 children ages 4, 51, 3, 31.  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  Not fasting today, ate sunflower seeds and drank an energy drink.  Depression Screen done today and results listed below:  Depression screen [facility] 2/9 02/12/2021 05/26/2020 02/12/2020 08/25/2018 01/24/2017  Decreased Interest 0 0 0 0 0  Down, Depressed, Hopeless 0 0 0 0 0  PHQ - 2 Score 0 0 0 0 0  Altered sleeping - - 0 - -  Tired, decreased energy - - 0 - -  Change in appetite - - 0 - -  Feeling bad or failure about yourself  - - 0 - -  Trouble concentrating - - 0 - -  Moving slowly or fidgety/restless - - 0 - -  Suicidal thoughts - - 0 - -  PHQ-9 Score - - 0 - -  Difficult doing work/chores - - Not difficult at all - -    The patient does not have a history of falls. I did not complete a risk assessment for falls. A plan of care for falls was not documented.   Past Medical History:  Past Medical History:  Diagnosis Date   Elevated BP without diagnosis of hypertension 02/12/2020   High triglycerides 02/13/2020    Surgical History:  History reviewed. No pertinent surgical history.  Medications:  No current outpatient medications on file prior to visit.   No current facility-administered medications on file prior to visit.    Allergies:  No Known Allergies  Social History:  Social History   Socioeconomic History   Marital status: Significant Other    Spouse name: Not on file   Number of children: Not on file   Years of education: Not on file   Highest education level: Not on file  Occupational History    Not on file  Tobacco Use   Smoking status: Former    Types: Cigars   Smokeless tobacco: Never  Substance and Sexual Activity   Alcohol use: Yes    Alcohol/week: 1.0 standard drink    Types: 1 Cans of beer per week    Comment: occassional   Drug use: No   Sexual activity: Yes    Comment: one partner  Other Topics Concern   Not on file  Social History Narrative   Not on file   Social Determinants of Health   Financial Resource Strain: Not on file  Food Insecurity: Not on file  Transportation Needs: Not on file  Physical Activity: Not on file  Stress: Not on file  Social Connections: Not on file  Intimate Partner Violence: Not on file   Social History   Tobacco Use  Smoking Status Former   Types: Cigars  Smokeless Tobacco Never   Social History   Substance and Sexual Activity  Alcohol Use Yes   Alcohol/week: 1.0 standard drink   Types: 1 Cans of beer per week   Comment: occassional    Family History:  Family History  Problem Relation  Age of Onset   Stroke Father 13    Past medical history, surgical history, medications, allergies, family history and social history reviewed with patient today and changes made to appropriate areas of the chart.   Review of Systems  Constitutional: Negative.   HENT: Negative.    Eyes: Negative.   Respiratory: Negative.    Cardiovascular: Negative.   Genitourinary: Negative.   Skin: Negative.   Neurological: Negative.   Psychiatric/Behavioral: Negative.        Objective:    BP 122/78   Pulse 66   Ht 5\' 5"  (1.651 m)   Wt 170 lb (77.1 kg)   SpO2 99%   BMI 28.29 kg/m   Wt Readings from Last 3 Encounters:  02/12/21 170 lb (77.1 kg)  05/30/20 181 lb (82.1 kg)  05/27/20 181 lb (82.1 kg)    Physical Exam Vitals and nursing note reviewed.  Constitutional:      General: He is not in acute distress.    Appearance: Normal appearance. He is normal weight. He is not toxic-appearing.  HENT:     Head: Normocephalic and  atraumatic.     Right Ear: Tympanic membrane, ear canal and external ear normal.     Left Ear: Tympanic membrane, ear canal and external ear normal.     Nose: Nose normal. No congestion.     Mouth/Throat:     Mouth: Mucous membranes are moist.     Pharynx: Oropharynx is clear. No oropharyngeal exudate or posterior oropharyngeal erythema.  Eyes:     General: No scleral icterus.    Extraocular Movements: Extraocular movements intact.     Conjunctiva/sclera: Conjunctivae normal.     Pupils: Pupils are equal, round, and reactive to light.  Neck:     Vascular: No carotid bruit.  Cardiovascular:     Rate and Rhythm: Normal rate and regular rhythm.     Heart sounds: Normal heart sounds. No murmur heard.   No gallop.  Pulmonary:     Effort: Pulmonary effort is normal. No respiratory distress.     Breath sounds: Normal breath sounds. No wheezing or rhonchi.  Abdominal:     General: Abdomen is flat. Bowel sounds are normal. There is no distension.     Palpations: Abdomen is soft.     Tenderness: There is no abdominal tenderness.  Genitourinary:    Comments: Exam deferred using shared decision making Musculoskeletal:        General: No swelling or tenderness. Normal range of motion.     Cervical back: Normal range of motion and neck supple. No tenderness.     Right lower leg: No edema.     Left lower leg: No edema.  Skin:    General: Skin is warm and dry.     Capillary Refill: Capillary refill takes less than 2 seconds.     Coloration: Skin is not jaundiced.     Findings: No lesion.  Neurological:     General: No focal deficit present.     Mental Status: He is alert and oriented to person, place, and time. Mental status is at baseline.     Motor: No weakness.     Gait: Gait normal.  Psychiatric:        Mood and Affect: Mood normal.        Behavior: Behavior normal.        Thought Content: Thought content normal.        Judgment: Judgment normal.      Assessment &  Plan:    Problem List Items Addressed This Visit       Other   Prediabetes    Chronic.  Check A1c today.  Last A1c 5.7%.  Patient has been working somewhat on dietary and lifestyle changes.  Encouraged preparing food at home, eating whole foods.  Follow up pending results.       Relevant Orders   Hemoglobin A1c   High triglycerides    Chronic.  Will check lipid panel although patient is not currently fasting.  Follow up pending results.       Relevant Orders   Lipid panel   Cigar smoker    Chronic.  Encouraged complete cessation, although patient is in precompemplative phase.        Other Visit Diagnoses     Annual physical exam    -  Primary   Relevant Orders   Lipid panel   CBC with Differential/Platelet   COMPLETE METABOLIC PANEL WITH GFR   Hemoglobin A1c   Screening for deficiency anemia       Relevant Orders   CBC with Differential/Platelet   Screening for metabolic disorder       Relevant Orders   COMPLETE METABOLIC PANEL WITH GFR        Discussed aspirin prophylaxis for myocardial infarction prevention and decision was it was not indicated  LABORATORY TESTING:  Health maintenance labs ordered today as discussed above.    IMMUNIZATIONS:   - Tdap: Tetanus vaccination status reviewed: last tetanus booster within 10 years. - Influenza: Refused - Pneumovax: Refused - Prevnar: Refused - HPV: Not applicable - Zostavax vaccine: Not applicable - COVID-19 vaccine: has had 2 doses, declines further vaccination  SCREENING: - Colonoscopy: Not applicable  Discussed with patient purpose of the colonoscopy is to detect colon cancer at curable precancerous or early stages   - AAA Screening: Not applicable  -Hearing Test: Not applicable  -Spirometry: Not applicable   PATIENT COUNSELING:     Advised to quit cigar smoking.   I discussed with the patient that most people either abstain from alcohol or drink within safe limits (<=14/week and <=4 drinks/occasion for  males, <=7/weeks and <= 3 drinks/occasion for females) and that the risk for alcohol disorders and other health effects rises proportionally with the number of drinks per week and how often a drinker exceeds daily limits.  Discussed cessation/primary prevention of drug use and availability of treatment for abuse.   Diet: Encouraged to adjust caloric intake to maintain  or achieve ideal body weight, to reduce intake of dietary saturated fat and total fat, to limit sodium intake by avoiding high sodium foods and not adding table salt, and to maintain adequate dietary potassium and calcium preferably from fresh fruits, vegetables, and low-fat dairy products.    stressed the importance of regular exercise  Injury prevention: Discussed safety belts, safety helmets, smoke detector, smoking near bedding or upholstery.   Dental health: Discussed importance of regular tooth brushing, flossing, and dental visits.   Follow up plan: NEXT PREVENTATIVE PHYSICAL DUE IN 1 YEAR. Return for pending lab work.'

## 2021-02-12 NOTE — Assessment & Plan Note (Addendum)
Chronic.  Encouraged complete cessation, although patient is in precompemplative phase.

## 2021-02-12 NOTE — Assessment & Plan Note (Signed)
Chronic.  Check A1c today.  Last A1c 5.7%.  Patient has been working somewhat on dietary and lifestyle changes.  Encouraged preparing food at home, eating whole foods.  Follow up pending results.

## 2021-02-13 LAB — CBC WITH DIFFERENTIAL/PLATELET
Absolute Monocytes: 684 cells/uL (ref 200–950)
Basophils Absolute: 40 cells/uL (ref 0–200)
Basophils Relative: 0.7 %
Eosinophils Absolute: 108 cells/uL (ref 15–500)
Eosinophils Relative: 1.9 %
HCT: 46.7 % (ref 38.5–50.0)
Hemoglobin: 16.2 g/dL (ref 13.2–17.1)
Lymphs Abs: 2172 cells/uL (ref 850–3900)
MCH: 31.7 pg (ref 27.0–33.0)
MCHC: 34.7 g/dL (ref 32.0–36.0)
MCV: 91.4 fL (ref 80.0–100.0)
MPV: 10.8 fL (ref 7.5–12.5)
Monocytes Relative: 12 %
Neutro Abs: 2696 cells/uL (ref 1500–7800)
Neutrophils Relative %: 47.3 %
Platelets: 286 10*3/uL (ref 140–400)
RBC: 5.11 10*6/uL (ref 4.20–5.80)
RDW: 13 % (ref 11.0–15.0)
Total Lymphocyte: 38.1 %
WBC: 5.7 10*3/uL (ref 3.8–10.8)

## 2021-02-13 LAB — COMPLETE METABOLIC PANEL WITH GFR
AG Ratio: 1.8 (calc) (ref 1.0–2.5)
ALT: 33 U/L (ref 9–46)
AST: 22 U/L (ref 10–40)
Albumin: 4.9 g/dL (ref 3.6–5.1)
Alkaline phosphatase (APISO): 76 U/L (ref 36–130)
BUN: 15 mg/dL (ref 7–25)
CO2: 25 mmol/L (ref 20–32)
Calcium: 10 mg/dL (ref 8.6–10.3)
Chloride: 100 mmol/L (ref 98–110)
Creat: 0.9 mg/dL (ref 0.60–1.26)
Globulin: 2.7 g/dL (calc) (ref 1.9–3.7)
Glucose, Bld: 98 mg/dL (ref 65–99)
Potassium: 4.3 mmol/L (ref 3.5–5.3)
Sodium: 137 mmol/L (ref 135–146)
Total Bilirubin: 0.8 mg/dL (ref 0.2–1.2)
Total Protein: 7.6 g/dL (ref 6.1–8.1)
eGFR: 113 mL/min/{1.73_m2} (ref >=60)

## 2021-02-13 LAB — LIPID PANEL
Cholesterol: 156 mg/dL (ref <200)
HDL: 46 mg/dL (ref >=40)
LDL Cholesterol (Calc): 89 mg/dL (calc)
Non-HDL Cholesterol (Calc): 110 mg/dL (calc) (ref <130)
Total CHOL/HDL Ratio: 3.4 (calc) (ref <5.0)
Triglycerides: 118 mg/dL (ref <150)

## 2021-02-13 LAB — HEMOGLOBIN A1C
Hgb A1c MFr Bld: 5.7 % of total Hgb — ABNORMAL HIGH (ref <5.7)
Mean Plasma Glucose: 117 mg/dL
eAG (mmol/L): 6.5 mmol/L

## 2021-02-16 ENCOUNTER — Telehealth: Payer: Self-pay

## 2021-02-16 NOTE — Telephone Encounter (Signed)
-----   Message from Valentino Nose, NP sent at 02/13/2021  8:00 AM EST ----- Please notify with labs.  Blood counts are normal.  Electrolytes, kidney function, liver enzymes are normal.  HgbA1c is at 5.7% which remains in the prediabetes range.  Cholesterol levels are excellent.  Follow up 1 year as planned.

## 2021-02-16 NOTE — Telephone Encounter (Signed)
Patient aware of results and recommendations. °

## 2022-02-10 ENCOUNTER — Ambulatory Visit (INDEPENDENT_AMBULATORY_CARE_PROVIDER_SITE_OTHER): Payer: BC Managed Care – PPO | Admitting: Family Medicine

## 2022-02-10 ENCOUNTER — Encounter: Payer: Self-pay | Admitting: Family Medicine

## 2022-02-10 VITALS — BP 130/84 | HR 71 | Temp 98.3°F | Ht 65.0 in | Wt 173.0 lb

## 2022-02-10 DIAGNOSIS — Z87898 Personal history of other specified conditions: Secondary | ICD-10-CM | POA: Diagnosis not present

## 2022-02-10 DIAGNOSIS — Z0000 Encounter for general adult medical examination without abnormal findings: Secondary | ICD-10-CM | POA: Diagnosis not present

## 2022-02-10 DIAGNOSIS — E161 Other hypoglycemia: Secondary | ICD-10-CM | POA: Diagnosis not present

## 2022-02-10 HISTORY — DX: Encounter for general adult medical examination without abnormal findings: Z00.00

## 2022-02-10 NOTE — Progress Notes (Signed)
New Patient Office Visit  Subjective    Patient ID: Ronald Combs, male    DOB: 06/09/1985  Age: 36 y.o. MRN: [id number]  CC:  Chief Complaint  Patient presents with   Establish Care   Follow-up    Work physical    HPI Ronald Combs presents to establish care. Oriented to practice routines and expectations. Reports eating a regular diet with no exercise routine but does stay active. No concerns today.  Declines STI testing Declines Flu  No outpatient encounter medications on file as of 02/10/2022.   No facility-administered encounter medications on file as of 02/10/2022.    Past Medical History:  Diagnosis Date   Elevated BP without diagnosis of hypertension 02/12/2020   High triglycerides 02/13/2020    History reviewed. No pertinent surgical history.  Family History  Problem Relation Age of Onset   Stroke Father 69    Social History   Socioeconomic History   Marital status: Significant Other    Spouse name: Not on file   Number of children: Not on file   Years of education: Not on file   Highest education level: Not on file  Occupational History   Not on file  Tobacco Use   Smoking status: Former    Types: Cigars   Smokeless tobacco: Never  Substance and Sexual Activity   Alcohol use: Yes    Alcohol/week: 1.0 standard drink of alcohol    Types: 1 Cans of beer per week    Comment: occassional   Drug use: No   Sexual activity: Yes    Comment: one partner  Other Topics Concern   Not on file  Social History Narrative   Not on file   Social Determinants of Health   Financial Resource Strain: Not on file  Food Insecurity: Not on file  Transportation Needs: Not on file  Physical Activity: Not on file  Stress: Not on file  Social Connections: Not on file  Intimate Partner Violence: Not on file    Review of Systems  Constitutional: Negative.   HENT: Negative.    Eyes: Negative.   Respiratory: Negative.    Cardiovascular: Negative.    Gastrointestinal: Negative.   Genitourinary: Negative.   Musculoskeletal: Negative.   Skin: Negative.   Neurological: Negative.   Endo/Heme/Allergies: Negative.   Psychiatric/Behavioral: Negative.    All other systems reviewed and are negative.       Objective    BP 130/84   Pulse 71   Temp 98.3 F (36.8 C) (Oral)   Ht 5\' 5"  (1.651 m)   Wt 173 lb (78.5 kg)   SpO2 96%   BMI 28.79 kg/m   Physical Exam Vitals and nursing note reviewed.  Constitutional:      Appearance: Normal appearance. He is normal weight.  HENT:     Head: Normocephalic and atraumatic.     Right Ear: Tympanic membrane, ear canal and external ear normal.     Left Ear: Tympanic membrane, ear canal and external ear normal.     Nose: Nose normal.     Mouth/Throat:     Mouth: Mucous membranes are moist.     Pharynx: Oropharynx is clear.  Eyes:     Extraocular Movements: Extraocular movements intact.     Right eye: Normal extraocular motion and no nystagmus.     Left eye: Normal extraocular motion and no nystagmus.     Conjunctiva/sclera: Conjunctivae normal.     Pupils: Pupils are equal, round, and  reactive to light.  Cardiovascular:     Rate and Rhythm: Normal rate and regular rhythm.     Pulses: Normal pulses.     Heart sounds: Normal heart sounds.  Pulmonary:     Effort: Pulmonary effort is normal.     Breath sounds: Normal breath sounds.  Abdominal:     General: Bowel sounds are normal.     Palpations: Abdomen is soft.  Musculoskeletal:        General: Normal range of motion.     Cervical back: Normal range of motion and neck supple.  Skin:    General: Skin is warm and dry.     Capillary Refill: Capillary refill takes less than 2 seconds.  Neurological:     General: No focal deficit present.     Mental Status: He is alert and oriented to person, place, and time.  Psychiatric:        Mood and Affect: Mood normal.        Speech: Speech normal.        Behavior: Behavior normal.         Thought Content: Thought content normal.        Cognition and Memory: Cognition and memory normal.        Judgment: Judgment normal.        Assessment & Plan:   Problem List Items Addressed This Visit   None Visit Diagnoses     Physical exam, annual    -  Primary   Relevant Orders   CBC with Differential/Platelet   COMPLETE METABOLIC PANEL WITH GFR   Lipid panel   History of prediabetes       Relevant Orders   Hemoglobin A1c       Return in about 1 year (around 02/11/2023) for annual physical.   Park Meo, FNP

## 2022-02-10 NOTE — Assessment & Plan Note (Signed)
Routine physical with labs completed today. I encouraged him to consume a well-balanced, carb conscious diet and get 150 minutes of moderate intensity exercise weekly. Discussed the importance of tobacco cessation. Follow-up routine physical in 1 year.

## 2022-02-11 ENCOUNTER — Other Ambulatory Visit: Payer: Self-pay | Admitting: Family Medicine

## 2022-02-11 ENCOUNTER — Telehealth: Payer: Self-pay | Admitting: Family Medicine

## 2022-02-11 DIAGNOSIS — R7989 Other specified abnormal findings of blood chemistry: Secondary | ICD-10-CM

## 2022-02-11 LAB — COMPLETE METABOLIC PANEL WITH GFR
AG Ratio: 1.7 (calc) (ref 1.0–2.5)
ALT: 37 U/L (ref 9–46)
AST: 30 U/L (ref 10–40)
Albumin: 4.9 g/dL (ref 3.6–5.1)
Alkaline phosphatase (APISO): 72 U/L (ref 36–130)
BUN/Creatinine Ratio: 20 (calc) (ref 6–22)
BUN: 26 mg/dL — ABNORMAL HIGH (ref 7–25)
CO2: 26 mmol/L (ref 20–32)
Calcium: 10.6 mg/dL — ABNORMAL HIGH (ref 8.6–10.3)
Chloride: 103 mmol/L (ref 98–110)
Creat: 1.32 mg/dL — ABNORMAL HIGH (ref 0.60–1.26)
Globulin: 2.9 g/dL (calc) (ref 1.9–3.7)
Glucose, Bld: 94 mg/dL (ref 65–99)
Potassium: 4.4 mmol/L (ref 3.5–5.3)
Sodium: 139 mmol/L (ref 135–146)
Total Bilirubin: 0.5 mg/dL (ref 0.2–1.2)
Total Protein: 7.8 g/dL (ref 6.1–8.1)
eGFR: 71 mL/min/{1.73_m2} (ref >=60)

## 2022-02-11 LAB — LIPID PANEL
Cholesterol: 179 mg/dL (ref <200)
HDL: 54 mg/dL (ref >=40)
LDL Cholesterol (Calc): 89 mg/dL (calc)
Non-HDL Cholesterol (Calc): 125 mg/dL (calc) (ref <130)
Total CHOL/HDL Ratio: 3.3 (calc) (ref <5.0)
Triglycerides: 297 mg/dL — ABNORMAL HIGH (ref <150)

## 2022-02-11 LAB — CBC WITH DIFFERENTIAL/PLATELET
Absolute Monocytes: 660 cells/uL (ref 200–950)
Basophils Absolute: 28 cells/uL (ref 0–200)
Basophils Relative: 0.4 %
Eosinophils Absolute: 149 cells/uL (ref 15–500)
Eosinophils Relative: 2.1 %
HCT: 42.1 % (ref 38.5–50.0)
Hemoglobin: 14.9 g/dL (ref 13.2–17.1)
Lymphs Abs: 2322 cells/uL (ref 850–3900)
MCH: 32.3 pg (ref 27.0–33.0)
MCHC: 35.4 g/dL (ref 32.0–36.0)
MCV: 91.1 fL (ref 80.0–100.0)
MPV: 11.4 fL (ref 7.5–12.5)
Monocytes Relative: 9.3 %
Neutro Abs: 3941 cells/uL (ref 1500–7800)
Neutrophils Relative %: 55.5 %
Platelets: 242 10*3/uL (ref 140–400)
RBC: 4.62 10*6/uL (ref 4.20–5.80)
RDW: 12.8 % (ref 11.0–15.0)
Total Lymphocyte: 32.7 %
WBC: 7.1 10*3/uL (ref 3.8–10.8)

## 2022-02-11 LAB — HEMOGLOBIN A1C
Hgb A1c MFr Bld: 5.7 % of total Hgb — ABNORMAL HIGH (ref <5.7)
Mean Plasma Glucose: 117 mg/dL
eAG (mmol/L): 6.5 mmol/L

## 2022-02-11 NOTE — Telephone Encounter (Signed)
Attempted to contact patient regarding his lab results. Left message for CMA to continue with attempts tomorrow. Will repeat CMP on Monday if he can push fluids this weekend.

## 2022-09-25 IMAGING — DX DG FINGER INDEX 2+V*R*
3 series · 3 of 3 positions shown · non-contrast
Comparison: Contralateral finger from Monday October, 2008.

CLINICAL DATA: Paronychia of the index finger without abscess.

EXAM:
RIGHT INDEX FINGER 2+V

[finger ap]
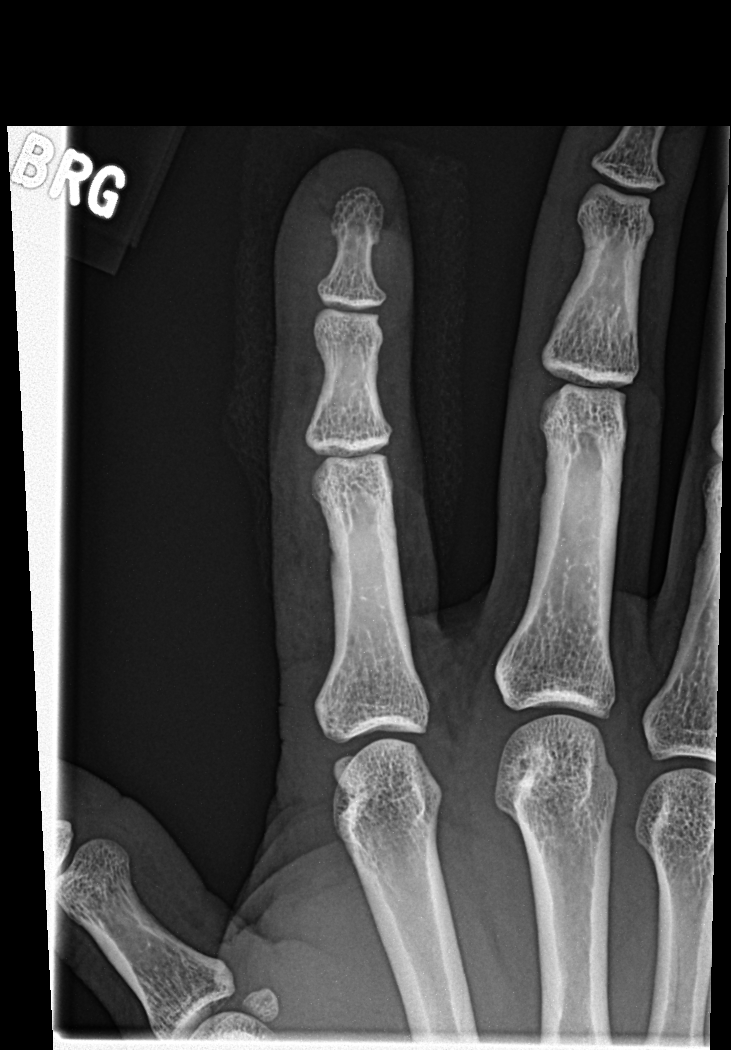

[finger obl]
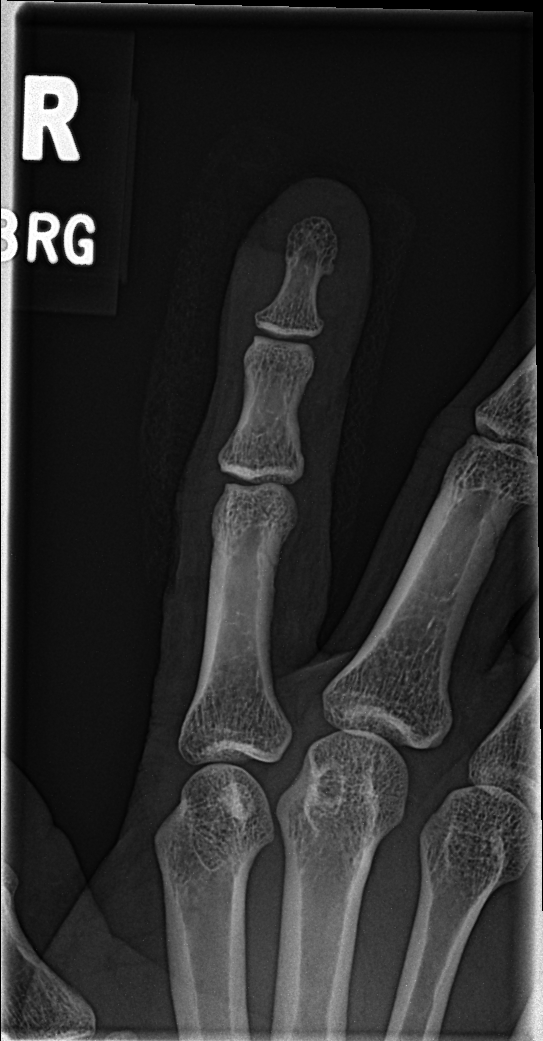

[finger lat]
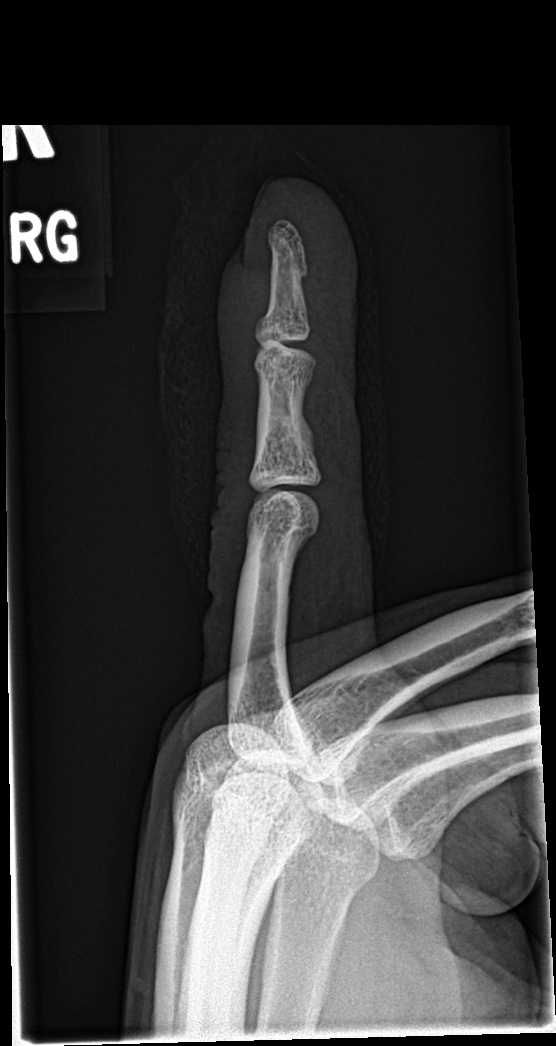

[3 of 3 positions shown; findings below may reference images not displayed]

FINDINGS: Soft tissue swelling about the index finger. Dressing in place over
the distal aspect of the finger. No visible gas in the soft tissues.
No bony erosion or acute bony abnormality.
IMPRESSION: Soft tissue swelling about the index finger. No acute bony
abnormality. No findings to suggest osteomyelitis.

## 2023-01-17 ENCOUNTER — Ambulatory Visit
Admission: EM | Admit: 2023-01-17 | Discharge: 2023-01-17 | Disposition: A | Discharge status: Home / Self Care | Payer: BC Managed Care – PPO | Attending: Internal Medicine | Admitting: Internal Medicine

## 2023-01-17 ENCOUNTER — Ambulatory Visit: Payer: BC Managed Care – PPO

## 2023-01-17 DIAGNOSIS — M109 Gout, unspecified: Secondary | ICD-10-CM | POA: Diagnosis not present

## 2023-01-17 DIAGNOSIS — Z8739 Personal history of other diseases of the musculoskeletal system and connective tissue: Secondary | ICD-10-CM | POA: Diagnosis not present

## 2023-01-17 DIAGNOSIS — M19071 Primary osteoarthritis, right ankle and foot: Secondary | ICD-10-CM | POA: Diagnosis not present

## 2023-01-17 DIAGNOSIS — M10071 Idiopathic gout, right ankle and foot: Secondary | ICD-10-CM | POA: Diagnosis not present

## 2023-01-17 DIAGNOSIS — M79671 Pain in right foot: Secondary | ICD-10-CM | POA: Diagnosis not present

## 2023-01-17 MED ORDER — PREDNISONE 10 MG PO TABS: 40.0000 mg | ORAL_TABLET | Freq: Every day | ORAL | 0 refills | Status: AC

## 2023-01-17 MED ORDER — COLCHICINE 0.6 MG PO TABS: 0.6000 mg | ORAL_TABLET | Freq: Every day | ORAL | 0 refills | Status: AC

## 2023-01-17 NOTE — ED Provider Notes (Signed)
[facility]    CSN: [id number] Arrival date & time: 01/17/23  4332      History   Chief Complaint Chief Complaint  Patient presents with   Foot Swelling    HPI Ronald Combs is a 37 y.o. male.   37 year old male who presents to urgent care with 2-day history of right great toe joint pain.  He reports the pain is isolated to the medial aspect of the great toe joint.  It is very tender to the touch and makes it difficult for him to stand and walk.  He has had this in the past and has been treated for gout.  He denies any fevers or chills.  He denies any injury to the area.  He has been using ice and ibuprofen with only minimal relief.  He does walk a lot at work and has to wear steel toed boots.      Past Medical History:  Diagnosis Date   Elevated BP without diagnosis of hypertension 02/12/2020   High triglycerides 02/13/2020   Physical exam, annual 02/10/2022    Patient Active Problem List   Diagnosis Date Noted   Physical exam, annual 02/10/2022   History of prediabetes 02/10/2022   Cigar smoker 02/12/2021   Prediabetes 02/13/2020   High triglycerides 02/13/2020    History reviewed. No pertinent surgical history.     Home Medications    Prior to Admission medications   Not on File    Family History Family History  Problem Relation Age of Onset   Stroke Father 84    Social History Social History   Tobacco Use   Smoking status: Former    Types: Cigars   Smokeless tobacco: Never  Substance Use Topics   Alcohol use: Yes    Alcohol/week: 1.0 standard drink of alcohol    Types: 1 Cans of beer per week    Comment: occassional   Drug use: No     Allergies   Patient has no known allergies.   Review of Systems Review of Systems  Constitutional:  Negative for chills and fever.  HENT:  Negative for ear pain and sore throat.   Eyes:  Negative for pain and visual disturbance.  Respiratory:  Negative for cough and shortness of breath.    Cardiovascular:  Negative for chest pain and palpitations.  Gastrointestinal:  Negative for abdominal pain and vomiting.  Genitourinary:  Negative for dysuria and hematuria.  Musculoskeletal:  Positive for joint swelling. Negative for arthralgias and back pain.  Skin:  Negative for color change and rash.  Neurological:  Negative for seizures and syncope.  All other systems reviewed and are negative.    Physical Exam Triage Vital Signs ED Triage Vitals  Encounter Vitals Group     BP 01/17/23 1005 (!) 143/88     Systolic BP Percentile --      Diastolic BP Percentile --      Pulse Rate 01/17/23 1005 65     Resp 01/17/23 1005 17     Temp 01/17/23 1005 98 F (36.7 C)     Temp Source 01/17/23 1005 Oral     SpO2 01/17/23 1005 94 %     Weight --      Height --      Head Circumference --      Peak Flow --      Pain Score 01/17/23 1004 6     Pain Loc --      Pain Education --  Exclude from Growth Chart --    No data found.  Updated Vital Signs BP (!) 143/88 (BP Location: Right Arm)   Pulse 65   Temp 98 F (36.7 C) (Oral)   Resp 17   SpO2 94%   Visual Acuity Right Eye Distance:   Left Eye Distance:   Bilateral Distance:    Right Eye Near:   Left Eye Near:    Bilateral Near:     Physical Exam Vitals and nursing note reviewed.  Constitutional:      General: He is not in acute distress.    Appearance: He is well-developed.  HENT:     Head: Normocephalic and atraumatic.  Eyes:     Conjunctiva/sclera: Conjunctivae normal.  Cardiovascular:     Rate and Rhythm: Normal rate and regular rhythm.     Pulses:          Dorsalis pedis pulses are 2+ on the right side.       Posterior tibial pulses are 2+ on the right side.     Heart sounds: No murmur heard. Pulmonary:     Effort: Pulmonary effort is normal. No respiratory distress.     Breath sounds: Normal breath sounds.  Abdominal:     Palpations: Abdomen is soft.     Tenderness: There is no abdominal tenderness.   Musculoskeletal:        General: No swelling.     Cervical back: Neck supple.       Feet:  Feet:     Right foot:     Skin integrity: Skin integrity normal.  Skin:    General: Skin is warm and dry.     Capillary Refill: Capillary refill takes less than 2 seconds.  Neurological:     Mental Status: He is alert.  Psychiatric:        Mood and Affect: Mood normal.      UC Treatments / Results  Labs (all labs ordered are listed, but only abnormal results are displayed) Labs Reviewed - No data to display  EKG   Radiology No results found.  Procedures Procedures (including critical care time)  Medications Ordered in UC Medications - No data to display  Initial Impression / Assessment and Plan / UC Course  I have reviewed the triage vital signs and the nursing notes.  Pertinent labs & imaging results that were available during my care of the patient were reviewed by me and considered in my medical decision making (see chart for details).     Acute gout involving toe of right foot, unspecified cause   Gout of the right great toe. Will treat with the following: Colchicine 0.6mg  daily for 10 days Prednisone 40mg  daily for 7 days Return to urgent care or PCP if symptoms worsen or fail to resolve.   Final Clinical Impressions(s) / UC Diagnoses   Final diagnoses:  None   Discharge Instructions   None    ED Prescriptions   None    PDMP not reviewed this encounter.   Landis Martins, [place] 01/17/23 1048

## 2023-01-17 NOTE — ED Triage Notes (Signed)
Pt presents with c/o rt foot swelling X 2 days. States it randomly started, has concerns of gout.   Home interventions: Ibuprofen, gave temporary relief.

## 2023-01-17 NOTE — Discharge Instructions (Addendum)
X-ray done today does not show an acute fracture. Pain most likely from Gout of the right great toe. Will treat with the following: Colchicine 0.6mg  daily for 10 days Prednisone 40mg  daily for 7 days Return to urgent care or PCP if symptoms worsen or fail to resolve.

## 2023-02-17 DIAGNOSIS — R0683 Snoring: Secondary | ICD-10-CM | POA: Diagnosis not present

## 2023-02-17 DIAGNOSIS — E7800 Pure hypercholesterolemia, unspecified: Secondary | ICD-10-CM | POA: Diagnosis not present

## 2023-02-17 DIAGNOSIS — Z131 Encounter for screening for diabetes mellitus: Secondary | ICD-10-CM | POA: Diagnosis not present

## 2023-02-17 DIAGNOSIS — Z1159 Encounter for screening for other viral diseases: Secondary | ICD-10-CM | POA: Diagnosis not present

## 2023-02-17 DIAGNOSIS — R5383 Other fatigue: Secondary | ICD-10-CM | POA: Diagnosis not present

## 2023-02-17 DIAGNOSIS — F17210 Nicotine dependence, cigarettes, uncomplicated: Secondary | ICD-10-CM | POA: Diagnosis not present

## 2023-02-17 DIAGNOSIS — R030 Elevated blood-pressure reading, without diagnosis of hypertension: Secondary | ICD-10-CM | POA: Diagnosis not present

## 2023-02-17 DIAGNOSIS — E559 Vitamin D deficiency, unspecified: Secondary | ICD-10-CM | POA: Diagnosis not present

## 2023-02-17 DIAGNOSIS — Z0000 Encounter for general adult medical examination without abnormal findings: Secondary | ICD-10-CM | POA: Diagnosis not present

## 2023-02-17 DIAGNOSIS — Z6829 Body mass index (BMI) 29.0-29.9, adult: Secondary | ICD-10-CM | POA: Diagnosis not present

## 2023-03-08 ENCOUNTER — Encounter: Payer: BC Managed Care – PPO | Admitting: Family Medicine
